# Patient Record
Sex: Male | Born: 1975
Health system: Southern US, Community
[De-identification: ages and names within clinical notes are randomized; demographics above are authoritative.]

## PROBLEM LIST (undated history)

## (undated) ENCOUNTER — Ambulatory Visit (HOSPITAL_COMMUNITY): Admission: EM | Payer: Self-pay

## (undated) ENCOUNTER — Emergency Department (HOSPITAL_COMMUNITY): Admission: EM | Payer: Self-pay | Source: Home / Self Care

---

## 2012-11-22 ENCOUNTER — Encounter (HOSPITAL_COMMUNITY): Payer: Self-pay | Admitting: *Deleted

## 2012-11-22 ENCOUNTER — Emergency Department (HOSPITAL_COMMUNITY)
Admission: EM | Admit: 2012-11-22 | Discharge: 2012-11-22 | Disposition: A | Discharge status: Home / Self Care | Payer: Commercial Managed Care - PPO | Attending: Emergency Medicine | Admitting: Emergency Medicine

## 2012-11-22 ENCOUNTER — Emergency Department (HOSPITAL_COMMUNITY): Payer: Commercial Managed Care - PPO

## 2012-11-22 DIAGNOSIS — E876 Hypokalemia: Secondary | ICD-10-CM | POA: Insufficient documentation

## 2012-11-22 DIAGNOSIS — F10929 Alcohol use, unspecified with intoxication, unspecified: Secondary | ICD-10-CM

## 2012-11-22 DIAGNOSIS — S31139A Puncture wound of abdominal wall without foreign body, unspecified quadrant without penetration into peritoneal cavity, initial encounter: Secondary | ICD-10-CM

## 2012-11-22 DIAGNOSIS — Z23 Encounter for immunization: Secondary | ICD-10-CM | POA: Insufficient documentation

## 2012-11-22 DIAGNOSIS — W3301XA Accidental discharge of shotgun, initial encounter: Secondary | ICD-10-CM | POA: Insufficient documentation

## 2012-11-22 DIAGNOSIS — S3690XA Unspecified injury of unspecified intra-abdominal organ, initial encounter: Secondary | ICD-10-CM | POA: Insufficient documentation

## 2012-11-22 DIAGNOSIS — F1010 Alcohol abuse, uncomplicated: Secondary | ICD-10-CM | POA: Insufficient documentation

## 2012-11-22 DIAGNOSIS — Y929 Unspecified place or not applicable: Secondary | ICD-10-CM | POA: Insufficient documentation

## 2012-11-22 DIAGNOSIS — S31109A Unspecified open wound of abdominal wall, unspecified quadrant without penetration into peritoneal cavity, initial encounter: Secondary | ICD-10-CM

## 2012-11-22 DIAGNOSIS — Y9389 Activity, other specified: Secondary | ICD-10-CM | POA: Insufficient documentation

## 2012-11-22 LAB — TYPE AND SCREEN
ABO/RH(D): B POS
Antibody Screen: NEGATIVE
Unit division: 0
Unit division: 0

## 2012-11-22 LAB — COMPREHENSIVE METABOLIC PANEL
ALT: 10 U/L (ref 0–53)
Albumin: 4.2 g/dL (ref 3.5–5.2)
Alkaline Phosphatase: 116 U/L (ref 39–117)
BUN: 11 mg/dL (ref 6–23)
Chloride: 100 mEq/L (ref 96–112)
Potassium: 3.2 mEq/L — ABNORMAL LOW (ref 3.5–5.1)
Sodium: 138 mEq/L (ref 135–145)
Total Bilirubin: 0.6 mg/dL (ref 0.3–1.2)
Total Protein: 8.3 g/dL (ref 6.0–8.3)

## 2012-11-22 LAB — CBC
HCT: 41.8 % (ref 39.0–52.0)
MCH: 33.3 pg (ref 26.0–34.0)
MCHC: 35.9 g/dL (ref 30.0–36.0)
MCV: 92.9 fL (ref 78.0–100.0)
Platelets: 257 10*3/uL (ref 150–400)
RDW: 12.9 % (ref 11.5–15.5)
WBC: 11.5 10*3/uL — ABNORMAL HIGH (ref 4.0–10.5)

## 2012-11-22 LAB — URINALYSIS, MICROSCOPIC ONLY
Bilirubin Urine: NEGATIVE
Hgb urine dipstick: NEGATIVE
Ketones, ur: NEGATIVE mg/dL
Nitrite: NEGATIVE
Protein, ur: NEGATIVE mg/dL
Specific Gravity, Urine: 1.023 (ref 1.005–1.030)
Urobilinogen, UA: 1 mg/dL (ref 0.0–1.0)

## 2012-11-22 LAB — PROTIME-INR
INR: 0.93 (ref 0.00–1.49)
Prothrombin Time: 12.4 seconds (ref 11.6–15.2)

## 2012-11-22 LAB — ABO/RH: ABO/RH(D): B POS

## 2012-11-22 LAB — POCT I-STAT, CHEM 8
Creatinine, Ser: 1.4 mg/dL — ABNORMAL HIGH (ref 0.50–1.35)
HCT: 46 % (ref 39.0–52.0)
Hemoglobin: 15.6 g/dL (ref 13.0–17.0)
Potassium: 3.1 mEq/L — ABNORMAL LOW (ref 3.5–5.1)
Sodium: 142 mEq/L (ref 135–145)
TCO2: 23 mmol/L (ref 0–100)

## 2012-11-22 MED ORDER — SODIUM CHLORIDE 0.9 % IV SOLN
1000.0000 mL | Freq: Once | INTRAVENOUS | Status: AC
  Administered 2012-11-22: 1000 mL via INTRAVENOUS

## 2012-11-22 MED ORDER — TETANUS-DIPHTH-ACELL PERTUSSIS 5-2.5-18.5 LF-MCG/0.5 IM SUSP
0.5000 mL | Freq: Once | INTRAMUSCULAR | Status: AC
  Administered 2012-11-22: 0.5 mL via INTRAMUSCULAR
  Filled 2012-11-22: qty 0.5

## 2012-11-22 MED ORDER — SODIUM CHLORIDE 0.9 % IV SOLN: 1000.0000 mL | INTRAVENOUS | Status: DC

## 2012-11-22 MED ORDER — OXYCODONE-ACETAMINOPHEN 5-325 MG PO TABS: 1.0000 | ORAL_TABLET | ORAL | Status: DC | PRN

## 2012-11-22 NOTE — ED Notes (Signed)
Dr Andrey Campanile here seeing the pt

## 2012-11-22 NOTE — ED Notes (Signed)
THE PT REMAINS ALERT SKIN WARM AND DRY.  WOUNDS CLEANED AND BANDAGED  WITH DSD GOWN AND BLANKETS CHANGED
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
## 2012-11-23 ENCOUNTER — Telehealth (HOSPITAL_COMMUNITY): Payer: Self-pay | Admitting: Emergency Medicine

## 2012-11-23 NOTE — Telephone Encounter (Signed)
Left message to call prn
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
## 2018-01-30 ENCOUNTER — Ambulatory Visit: Payer: Commercial Managed Care - PPO | Admitting: Family Medicine

## 2018-01-30 DIAGNOSIS — Z0289 Encounter for other administrative examinations: Secondary | ICD-10-CM

## 2019-04-09 ENCOUNTER — Encounter: Payer: Self-pay | Admitting: Allergy

## 2019-04-09 ENCOUNTER — Ambulatory Visit (INDEPENDENT_AMBULATORY_CARE_PROVIDER_SITE_OTHER): Payer: 59 | Admitting: Allergy

## 2019-04-09 ENCOUNTER — Other Ambulatory Visit: Payer: Self-pay

## 2019-04-09 VITALS — BP 122/84 | HR 78 | Temp 98.2°F | Resp 18 | Ht 71.77 in | Wt 211.6 lb

## 2019-04-09 DIAGNOSIS — T783XXA Angioneurotic edema, initial encounter: Secondary | ICD-10-CM | POA: Insufficient documentation

## 2019-04-09 DIAGNOSIS — T781XXA Other adverse food reactions, not elsewhere classified, initial encounter: Secondary | ICD-10-CM | POA: Insufficient documentation

## 2019-04-09 DIAGNOSIS — T781XXD Other adverse food reactions, not elsewhere classified, subsequent encounter: Secondary | ICD-10-CM

## 2019-04-09 DIAGNOSIS — L508 Other urticaria: Secondary | ICD-10-CM

## 2019-04-09 DIAGNOSIS — T7819XA Other adverse food reactions, not elsewhere classified, initial encounter: Secondary | ICD-10-CM | POA: Insufficient documentation

## 2019-04-09 DIAGNOSIS — T783XXD Angioneurotic edema, subsequent encounter: Secondary | ICD-10-CM

## 2019-04-09 MED ORDER — EPINEPHRINE 0.3 MG/0.3ML IJ SOAJ: 0.3000 mg | Freq: Once | INTRAMUSCULAR | 2 refills | Status: AC

## 2019-04-09 NOTE — Progress Notes (Signed)
New Patient Note  RE: Jason Decker MRN: [id number] DOB: [birth year] Date of Office Visit: 04/09/2019  Referring provider: No ref. provider found Primary care provider: Patient, No Pcp Per  Chief Complaint: Allergic Reaction  History of Present Illness: I had the pleasure of seeing Jason Decker for initial evaluation at the [facility] [place] on 04/09/2019. He is a 43 y.o. male, who is self-referred here for the evaluation of food allergy.   Food: He reports food allergy to shrimp. The reaction occurred last 1-2weeks, after he ate some shrimp and grits and shrimp kabobs. Symptoms started within 60 minutes and was in the form of whole body pruritus, rash, throat clearing, lip swelling. Denies any wheezing, abdominal pain, diarrhea, vomiting. Denies any associated cofactors such as exertion, infection, NSAID use, or alcohol consumption.  His reaction after the shrimp kabobs was more severe with facial swelling. He also had a tuna sandwich the day before.  The symptoms lasted for a few hours after benadryl but now noticing the rash/hives returning. No ecchymosis upon resolution He was not evaluated in ED. Since this episode, he does not report other accidental exposures to shellfish. He does not have access to epinephrine.  Patient usually eats shrimp and seafood with no issues.   Past work up includes: none.  Dietary History: patient has been eating other foods including milk, eggs, peanut, treenuts, sesame, seafood, soy, wheat, meats, fruits and vegetables.  He reports reading labels and avoiding shellfish in diet completely. Denies any  fevers, chills, changes in medications, foods, personal care products or recent infections.   No recent bloodwork and no recent physical.  Assessment and Plan: Jason Decker is a 43 y.o. male with: Acute urticaria 2 episodes of urticaria and 1 episode of facial angioedema after consuming shrimp. Symptoms occur within 1 hour and resolve after  taking benadryl. Last episode 1 week ago and still having hives. Denies any changes in diet, medications, personal care products. They did switch to a new laundry detergent about 2 months ago. He has been eating shellfish and seafood prior to this with no issues.  No history of hives/angioedema.  Unable to skin test today due to recent antihistamine intake.  Start to avoid seafood/shellfish.  Start xyzal 5mg  daily at night and take twice a day to help with the hives.   If it makes you too drowsy in the morning then take allegra in the morning and xyzal at night.  Keep track of symptoms.   I have prescribed epinephrine injectable and demonstrated proper use. For mild symptoms you can take over the counter antihistamines such as Benadryl and monitor symptoms closely. If symptoms worsen or if you have severe symptoms including breathing issues, throat closure, significant swelling, whole body hives, severe diarrhea and vomiting, lightheadedness then inject epinephrine and seek immediate medical care afterwards.  Food action plan given.   Avoid the following potential triggers: alcohol, tight clothing, NSAIDs.   Follow up in 2 weeks for skin testing - No allergy medication (xyzal, allegra, benadryl) for 3 days prior to appointment. If you can't come off the medication then will order bloodwork instead.   If hives persistent then will also order urticaria panel bloodwork.   Angio-edema  See assessment and plan as above for urticaria.   Return in about 2 weeks (around 04/23/2019) for Skin testing.  Meds ordered this encounter  Medications  . EPINEPHrine (AUVI-Q) 0.3 mg/0.3 mL IJ SOAJ injection    Sig: Inject 0.3 mLs (0.3 mg  total) into the muscle once for 1 dose. As directed for life-threatening allergic reactions    Dispense:  2 each    Refill:  2   Other allergy screening: Asthma: no Rhino conjunctivitis: no Medication allergy: no Hymenoptera allergy: no Urticaria: no Eczema:no  History of recurrent infections suggestive of immunodeficency: no  Diagnostics: Skin Testing: Deferred due to recent antihistamines use.  Past Medical History: Patient Active Problem List   Diagnosis Date Noted  . Acute urticaria 04/09/2019  . Adverse food reaction 04/09/2019  . Angio-edema 04/09/2019   History reviewed. No pertinent past medical history. Past Surgical History: History reviewed. No pertinent surgical history. Medication List:  Current Outpatient Medications  Medication Sig Dispense Refill  . diphenhydrAMINE (BENADRYL) 25 mg capsule Take 50 mg by mouth every 6 (six) hours as needed.    Marland Kitchen. EPINEPHrine (AUVI-Q) 0.3 mg/0.3 mL IJ SOAJ injection Inject 0.3 mLs (0.3 mg total) into the muscle once for 1 dose. As directed for life-threatening allergic reactions 2 each 2   No current facility-administered medications for this visit.    Allergies: No Known Allergies Social History: Social History   Socioeconomic History  . Marital status: Single    Spouse name: Not on file  . Number of children: Not on file  . Years of education: Not on file  . Highest education level: Not on file  Occupational History  . Not on file  Social Needs  . Financial resource strain: Not on file  . Food insecurity    Worry: Not on file    Inability: Not on file  . Transportation needs    Medical: Not on file    Non-medical: Not on file  Tobacco Use  . Smoking status: Former Games developermoker  . Smokeless tobacco: Never Used  Substance and Sexual Activity  . Alcohol use: Not on file  . Drug use: Not on file  . Sexual activity: Not on file  Lifestyle  . Physical activity    Days per week: Not on file    Minutes per session: Not on file  . Stress: Not on file  Relationships  . Social Musicianconnections    Talks on phone: Not on file    Gets together: Not on file    Attends religious service: Not on file    Active member of club or organization: Not on file    Attends meetings of clubs or  organizations: Not on file    Relationship status: Not on file  Other Topics Concern  . Not on file  Social History Narrative  . Not on file   Lives in a house. Smoking: denies Occupation: works for [employer]   Environmental History: ImmunologistWater Damage/mildew in the house: no Engineer, civil (consulting)Carpet in the family room: no Carpet in the bedroom: yes Heating: gas Cooling: central Pet: yes 1 dog x 1 yr  Family History: History reviewed. No pertinent family history. Problem                               Relation Asthma                                   No  Eczema  No  Food allergy                          No  Allergic rhino conjunctivitis     No  Hives/angioedema  No  Review of Systems  Constitutional: Negative for appetite change, chills, fever and unexpected weight change.  HENT: Negative for congestion and rhinorrhea.   Eyes: Negative for itching.  Respiratory: Negative for cough, chest tightness, shortness of breath and wheezing.   Cardiovascular: Negative for chest pain.  Gastrointestinal: Negative for abdominal pain.  Genitourinary: Negative for difficulty urinating.  Skin: Positive for rash.  Neurological: Negative for headaches.   Objective: BP 122/84   Pulse 78   Temp 98.2 F (36.8 C) (Oral)   Resp 18   Ht 5' 11.77" (1.823 m)   Wt 211 lb 9.6 oz (96 kg)   SpO2 98%   BMI 28.88 kg/m  Body mass index is 28.88 kg/m. Physical Exam  Constitutional: He is oriented to person, place, and time. He appears well-developed and well-nourished.  HENT:  Head: Normocephalic and atraumatic.  Right Ear: External ear normal.  Left Ear: External ear normal.  Nose: Nose normal.  Mouth/Throat: Oropharynx is clear and moist.  Eyes: Conjunctivae and EOM are normal.  Neck: Neck supple.  Cardiovascular: Normal rate, regular rhythm and normal heart sounds. Exam reveals no gallop and no friction rub.  No murmur heard. Pulmonary/Chest: Effort normal and breath  sounds normal. He has no wheezes. He has no rales.  Abdominal: Soft.  Neurological: He is alert and oriented to person, place, and time.  Skin: Skin is warm. Rash noted.  Few hives on left upper extremity and left lower extremity. Blanchable.   Psychiatric: He has a normal mood and affect. His behavior is normal.  Nursing note and vitals reviewed.  The plan was reviewed with the patient/family, and all questions/concerned were addressed.  It was my pleasure to see Jason Decker today and participate in his care. Please feel free to contact me with any questions or concerns.  Sincerely,  Wyline MoodYoon Stepan Verrette, DO Allergy & Immunology  [facility] [place] office: [phone] [facility] office: [phone] [place] office: [phone]

## 2019-04-09 NOTE — Assessment & Plan Note (Signed)
2 episodes of urticaria and 1 episode of facial angioedema after consuming shrimp. Symptoms occur within 1 hour and resolve after taking benadryl. Last episode 1 week ago and still having hives. Denies any changes in diet, medications, personal care products. They did switch to a new laundry detergent about 2 months ago. He has been eating shellfish and seafood prior to this with no issues.  No history of hives/angioedema.  Unable to skin test today due to recent antihistamine intake.  Start to avoid seafood/shellfish.  Start xyzal 5mg  daily at night and take twice a day to help with the hives.   If it makes you too drowsy in the morning then take allegra in the morning and xyzal at night.  Keep track of symptoms.   I have prescribed epinephrine injectable and demonstrated proper use. For mild symptoms you can take over the counter antihistamines such as Benadryl and monitor symptoms closely. If symptoms worsen or if you have severe symptoms including breathing issues, throat closure, significant swelling, whole body hives, severe diarrhea and vomiting, lightheadedness then inject epinephrine and seek immediate medical care afterwards.  Food action plan given.   Avoid the following potential triggers: alcohol, tight clothing, NSAIDs.   Follow up in 2 weeks for skin testing - No allergy medication (xyzal, allegra, benadryl) for 3 days prior to appointment. If you can't come off the medication then will order bloodwork instead.   If hives persistent then will also order urticaria panel bloodwork.

## 2019-04-09 NOTE — Assessment & Plan Note (Signed)
   See assessment and plan as above for urticaria.  

## 2019-04-09 NOTE — Patient Instructions (Addendum)
Start to avoid seafood/shellfish.   Start xyzal 5mg  daily at night and take twice a day.  If it makes you too drowsy in the morning then take allegra in the morning and xyzal at night.  Keep track of symptoms.   I have prescribed epinephrine injectable and demonstrated proper use. For mild symptoms you can take over the counter antihistamines such as Benadryl and monitor symptoms closely. If symptoms worsen or if you have severe symptoms including breathing issues, throat closure, significant swelling, whole body hives, severe diarrhea and vomiting, lightheadedness then inject epinephrine and seek immediate medical care afterwards.  Food action plan given.  Avoid the following potential triggers: alcohol, tight clothing, NSAIDs.   Follow up in 2 weeks for skin testing - No allergy medication (xyzal, allegra, benadryl) for 3 days prior to appointment. If you can't come off the medication then let us know.

## 2019-04-23 ENCOUNTER — Ambulatory Visit (INDEPENDENT_AMBULATORY_CARE_PROVIDER_SITE_OTHER): Payer: 59 | Admitting: Allergy

## 2019-04-23 ENCOUNTER — Other Ambulatory Visit: Payer: Self-pay

## 2019-04-23 ENCOUNTER — Encounter: Payer: Self-pay | Admitting: Allergy

## 2019-04-23 ENCOUNTER — Encounter: Payer: 59 | Admitting: Allergy

## 2019-04-23 VITALS — BP 120/80 | HR 80 | Temp 98.6°F | Resp 16

## 2019-04-23 DIAGNOSIS — L509 Urticaria, unspecified: Secondary | ICD-10-CM

## 2019-04-23 DIAGNOSIS — T781XXD Other adverse food reactions, not elsewhere classified, subsequent encounter: Secondary | ICD-10-CM | POA: Diagnosis not present

## 2019-04-23 DIAGNOSIS — T783XXD Angioneurotic edema, subsequent encounter: Secondary | ICD-10-CM | POA: Diagnosis not present

## 2019-04-23 MED ORDER — LEVOCETIRIZINE DIHYDROCHLORIDE 5 MG PO TABS: 5.0000 mg | ORAL_TABLET | Freq: Every evening | ORAL | 1 refills | Status: DC

## 2019-04-23 MED ORDER — EPINEPHRINE 0.3 MG/0.3ML IJ SOAJ: 0.3000 mg | Freq: Once | INTRAMUSCULAR | 2 refills | Status: DC

## 2019-04-23 NOTE — Progress Notes (Signed)
Follow Up Note  RE: Jason Decker MRN: [id number] DOB: 03/14/1976 Date of Office Visit: 04/23/2019  Referring provider: No ref. provider found Primary care provider: Patient, No Pcp Per  Chief Complaint: Allergic Reaction  History of Present Illness: I had the pleasure of seeing Jason Decker for a follow up visit at the [facility] [place] on 04/25/2019. He is a 43 y.o. male, who is being followed for acute urticaria and angioedema. Today he is here for skin testing. His previous allergy office visit was on 04/09/2019 with Dr. Maudie Mercury.   Urticaria/food allergy No hives with taking xyzal daily. He has been off the medication for 1 week and yesterday he tried a little bit of a salmon salad. He noticed some throat discomfort immediately and woke up with facial hives the next day which is now improving without taking any medications. Denies any other symptoms.   Assessment and Plan: Jason Decker is a 43 y.o. male with: Adverse food reaction Past history - 2 episodes of urticaria and 1 episode of facial angioedema after consuming shrimp. Symptoms occur within 1 hour and resolve after taking benadryl. Last episode 1 week ago and still having hives. Denies any changes in diet, medications, personal care products. They did switch to a new laundry detergent about 2 months ago. He has been eating shellfish and seafood prior to this with no issues.  No history of hives/angioedema. Interim history - was doing well with daily xyzal but last night had salmon salad and noticed throat discomfort. Woke up today with hives.   Today's skin testing was negative. The patients history suggests seafood/shellfish allergy, though todays skin tests were negative despite a positive histamine control.  Food allergen skin testing has excellent negative predictive value however there is still a 5% chance that the allergy exists. Therefore, we will investigate further with serum specific IgE levels and, if negative  then schedule for open graded oral food challenge. A laboratory order form has been provided for serum specific IgE against seafood/shellfish. Until the food allergy has been definitively ruled out, the patient is to continue meticulous avoidance of seafood/shellfish and have access to epinephrine autoinjector 2 pack. I have prescribed epinephrine injectable and demonstrated proper use. For mild symptoms you can take over the counter antihistamines such as Benadryl and monitor symptoms closely. If symptoms worsen or if you have severe symptoms including breathing issues, throat closure, significant swelling, whole body hives, severe diarrhea and vomiting, lightheadedness then inject epinephrine and seek immediate medical care afterwards. Food action plan given.   Angio-edema  See assessment and plan as above for urticaria.   Acute urticaria Woke up with hives this morning. No hives while taking daily xyzal.  Given yesterday's exposure to salmon, it is difficult to decipher whether the hives were from the seafood exposure versus not having antihistamines on board.  Get bloodwork as below.  Start xyzal 5mg  daily at night and take twice a day to help with the hives if needed. ? If it makes you too drowsy in the morning then take allegra in the morning and xyzal at night.  Keep track of symptoms.   Avoid the following potential triggers: alcohol, tight clothing, NSAIDs.   Return in about 2 months (around 06/23/2019).  Meds ordered this encounter  Medications  . levocetirizine (XYZAL) 5 MG tablet    Sig: Take 1 tablet (5 mg total) by mouth every evening.    Dispense:  90 tablet    Refill:  1  .  EPINEPHrine (AUVI-Q) 0.3 mg/0.3 mL IJ SOAJ injection    Sig: Inject 0.3 mLs (0.3 mg total) into the muscle once for 1 dose. As directed for life-threatening allergic reactions    Dispense:  2 each    Refill:  2    Lab Orders     Allergen Profile, Food-Fish     CBC with Differential/Platelet      Comprehensive metabolic panel     Thyroid Cascade Profile     ANA w/Reflex     Alpha-Gal Panel     C3 and C4     Chronic Urticaria     C-reactive protein     Allergen Profile, Shellfish  Diagnostics: Skin Testing: Select foods. Negative test to: seafood/shellfish.  Results discussed with patient/family. Food Adult Perc - 04/23/19 1500    Time Antigen Placed  1516    Allergen Manufacturer  Waynette ButteryGreer    Location  Back    Number of allergen test  16     Control-buffer 50% Glycerol  Negative    Control-Histamine 1 mg/ml  2+    8. Shellfish Mix  Negative    9. Fish Mix  Negative    18. Catfish  Negative    19. Bass  Negative    20. Trout  Negative    21. Tuna  Negative    22. Salmon  Negative    23. Flounder  Negative    24. Codfish  Negative    25. Shrimp  Negative    26. Crab  Negative    27. Lobster  Negative    28. Oyster  Negative    29. Scallops  Negative       Medication List:  Current Outpatient Medications  Medication Sig Dispense Refill  . diphenhydrAMINE (BENADRYL) 25 mg capsule Take 50 mg by mouth every 6 (six) hours as needed.    Marland Kitchen. levocetirizine (XYZAL) 5 MG tablet Take 1 tablet (5 mg total) by mouth every evening. 90 tablet 1   No current facility-administered medications for this visit.    Allergies: Allergies  Allergen Reactions  . Shellfish Allergy Anaphylaxis   I reviewed his past medical history, social history, family history, and environmental history and no significant changes have been reported from previous visit on 04/09/2019.  Review of Systems  Constitutional: Negative for appetite change, chills, fever and unexpected weight change.  HENT: Negative for congestion and rhinorrhea.   Eyes: Negative for itching.  Respiratory: Negative for cough, chest tightness, shortness of breath and wheezing.   Cardiovascular: Negative for chest pain.  Gastrointestinal: Negative for abdominal pain.  Genitourinary: Negative for difficulty urinating.   Skin: Positive for rash.  Neurological: Negative for headaches.   Objective: BP 120/80   Pulse 80   Temp 98.6 F (37 C) (Oral)   Resp 16   SpO2 98%  There is no height or weight on file to calculate BMI. Physical Exam  Constitutional: He is oriented to person, place, and time. He appears well-developed and well-nourished.  HENT:  Head: Normocephalic and atraumatic.  Right Ear: External ear normal.  Left Ear: External ear normal.  Nose: Nose normal.  Mouth/Throat: Oropharynx is clear and moist.  Eyes: Conjunctivae and EOM are normal.  Neck: Neck supple.  Cardiovascular: Normal rate, regular rhythm and normal heart sounds. Exam reveals no gallop and no friction rub.  No murmur heard. Pulmonary/Chest: Effort normal and breath sounds normal. He has no wheezes. He has no rales.  Abdominal: Soft.  Neurological: He is  alert and oriented to person, place, and time.  Skin: Skin is warm. No rash noted.  Psychiatric: He has a normal mood and affect. His behavior is normal.  Nursing note and vitals reviewed.  Previous notes and tests were reviewed. The plan was reviewed with the patient/family, and all questions/concerned were addressed.  It was my pleasure to see Jason Decker today and participate in his care. Please feel free to contact me with any questions or concerns.  Sincerely,  Wyline MoodYoon Kim, DO Allergy & Immunology  [facility] office: [phone] [facility] office: [phone] [place] office: [phone]

## 2019-04-23 NOTE — Progress Notes (Deleted)
   Follow Up Note  RE: Jason Decker MRN: [id number] DOB: 06-25-1976 Date of Office Visit: 04/23/2019  Referring provider: No ref. provider found Primary care provider: Patient, No Pcp Per  Chief Complaint: No chief complaint on file.  History of Present Illness: I had the pleasure of seeing Jason Decker for a follow up visit at the [facility] [place] [facility] [place] on 04/23/2019. He is a 43 y.o. male, who is being followed for ***. Today he is here for ***regular follow up visit. *** new complaint of ***. He is accompanied today by his *** who provided/contributed to the history. His previous allergy office visit was on *** with {Blank single:19197::"Dr. Kim","Dr. Kozlow","Dr. Bardelas","Dr. Bobbitt","Dr. Gallagher","Dr. Wyona Almas Ambs, FNP"}.   ***  Assessment and Plan: Jason Decker is a 43 y.o. male with: No problem-specific Assessment & Plan notes found for this encounter.  No follow-ups on file.  No orders of the defined types were placed in this encounter.   Lab Orders     Allergen Profile, Shellfish  Diagnostics: Spirometry:  Tracings reviewed. His effort: {Blank single:19197::"Good reproducible efforts.","It was hard to get consistent efforts and there is a question as to whether this reflects a maximal maneuver.","Poor effort, data can not be interpreted."} FVC: ***L FEV1: ***L, ***% predicted FEV1/FVC ratio: ***% Interpretation: {Blank single:19197::"Spirometry consistent with mild obstructive disease","Spirometry consistent with moderate obstructive disease","Spirometry consistent with severe obstructive disease","Spirometry consistent with possible restrictive disease","Spirometry consistent with mixed obstructive and restrictive disease","Spirometry uninterpretable due to technique","Spirometry consistent with normal pattern","No overt abnormalities noted given today's efforts"}.  Please see scanned spirometry results for details.  Skin Testing: {Blank  single:19197::"Select foods","Environmental allergy panel","Environmental allergy panel and select foods","Food allergy panel","None","Deferred due to recent antihistamines use"}. Positive test to: ***. Negative test to: ***.  Results discussed with patient/family.   Medication List:  Current Outpatient Medications  Medication Sig Dispense Refill  . diphenhydrAMINE (BENADRYL) 25 mg capsule Take 50 mg by mouth every 6 (six) hours as needed.     No current facility-administered medications for this visit.    Allergies: Allergies  Allergen Reactions  . Shellfish Allergy Anaphylaxis   I reviewed his past medical history, social history, family history, and environmental history and no significant changes have been reported from previous visit on ***.  Review of Systems Objective: There were no vitals taken for this visit. There is no height or weight on file to calculate BMI. Physical Exam Previous notes and tests were reviewed. The plan was reviewed with the patient/family, and all questions/concerned were addressed.  It was my pleasure to see Jason Decker today and participate in his care. Please feel free to contact me with any questions or concerns.  Sincerely,  Rexene Alberts, DO Allergy & Immunology  [facility] office: [phone] [facility] office: [place] office: [phone]

## 2019-04-23 NOTE — Patient Instructions (Signed)
Today's skin testing was negative but I still want you to avoid seafood and shellfish.   Get bloodwork.   Start xyzal 5mg  daily at night and take twice a day to help with the hives.  ? If it makes you too drowsy in the morning then take allegra in the morning and xyzal at night.  Keep track of symptoms.   For mild symptoms you can take over the counter antihistamines such as Benadryl and monitor symptoms closely. If symptoms worsen or if you have severe symptoms including breathing issues, throat closure, significant swelling, whole body hives, severe diarrhea and vomiting, lightheadedness then inject epinephrine and seek immediate medical care afterwards.   Avoid the following potential triggers: alcohol, tight clothing, NSAIDs.   Follow up in 2 months

## 2019-04-25 ENCOUNTER — Encounter: Payer: Self-pay | Admitting: Allergy

## 2019-04-25 NOTE — Assessment & Plan Note (Signed)
Woke up with hives this morning. No hives while taking daily xyzal.  Given yesterday's exposure to salmon, it is difficult to decipher whether the hives were from the seafood exposure versus not having antihistamines on board.  Get bloodwork as below.  Start xyzal 5mg  daily at night and take twice a day to help with the hives if needed. ? If it makes you too drowsy in the morning then take allegra in the morning and xyzal at night.  Keep track of symptoms.   Avoid the following potential triggers: alcohol, tight clothing, NSAIDs.

## 2019-04-25 NOTE — Assessment & Plan Note (Addendum)
Past history - 2 episodes of urticaria and 1 episode of facial angioedema after consuming shrimp. Symptoms occur within 1 hour and resolve after taking benadryl. Last episode 1 week ago and still having hives. Denies any changes in diet, medications, personal care products. They did switch to a new laundry detergent about 2 months ago. He has been eating shellfish and seafood prior to this with no issues.  No history of hives/angioedema. Interim history - was doing well with daily xyzal but last night had salmon salad and noticed throat discomfort. Woke up today with hives.   Today's skin testing was negative. The patients history suggests seafood/shellfish allergy, though todays skin tests were negative despite a positive histamine control.  Food allergen skin testing has excellent negative predictive value however there is still a 5% chance that the allergy exists. Therefore, we will investigate further with serum specific IgE levels and, if negative then schedule for open graded oral food challenge. A laboratory order form has been provided for serum specific IgE against seafood/shellfish. Until the food allergy has been definitively ruled out, the patient is to continue meticulous avoidance of seafood/shellfish and have access to epinephrine autoinjector 2 pack. I have prescribed epinephrine injectable and demonstrated proper use. For mild symptoms you can take over the counter antihistamines such as Benadryl and monitor symptoms closely. If symptoms worsen or if you have severe symptoms including breathing issues, throat closure, significant swelling, whole body hives, severe diarrhea and vomiting, lightheadedness then inject epinephrine and seek immediate medical care afterwards. Food action plan given.

## 2019-04-25 NOTE — Progress Notes (Signed)
This encounter was created in error - please disregard.

## 2019-04-25 NOTE — Assessment & Plan Note (Signed)
   See assessment and plan as above for urticaria.  

## 2019-04-26 MED ORDER — EPINEPHRINE 0.3 MG/0.3ML IJ SOAJ: 0.3000 mg | INTRAMUSCULAR | 2 refills | Status: DC | PRN

## 2019-04-26 NOTE — Addendum Note (Signed)
Addended by: Lucrezia Starch I on: 04/26/2019 08:36 AM   Modules accepted: Orders

## 2019-05-31 ENCOUNTER — Ambulatory Visit: Payer: 59 | Admitting: Emergency Medicine

## 2019-05-31 ENCOUNTER — Encounter: Payer: Self-pay | Admitting: Emergency Medicine

## 2019-05-31 ENCOUNTER — Other Ambulatory Visit: Payer: Self-pay

## 2019-05-31 VITALS — BP 117/73 | HR 91 | Temp 98.5°F | Resp 16 | Ht 71.0 in | Wt 205.6 lb

## 2019-05-31 DIAGNOSIS — Z0001 Encounter for general adult medical examination with abnormal findings: Secondary | ICD-10-CM

## 2019-05-31 DIAGNOSIS — R6889 Other general symptoms and signs: Secondary | ICD-10-CM

## 2019-05-31 DIAGNOSIS — Z7689 Persons encountering health services in other specified circumstances: Secondary | ICD-10-CM

## 2019-05-31 DIAGNOSIS — Z130 Encounter for screening for diseases of the blood and blood-forming organs and certain disorders involving the immune mechanism: Secondary | ICD-10-CM

## 2019-05-31 DIAGNOSIS — Z1329 Encounter for screening for other suspected endocrine disorder: Secondary | ICD-10-CM

## 2019-05-31 DIAGNOSIS — R5383 Other fatigue: Secondary | ICD-10-CM | POA: Diagnosis not present

## 2019-05-31 DIAGNOSIS — Z13220 Encounter for screening for lipoid disorders: Secondary | ICD-10-CM | POA: Diagnosis not present

## 2019-05-31 DIAGNOSIS — Z13228 Encounter for screening for other metabolic disorders: Secondary | ICD-10-CM

## 2019-05-31 DIAGNOSIS — R5381 Other malaise: Secondary | ICD-10-CM

## 2019-05-31 DIAGNOSIS — Z20822 Contact with and (suspected) exposure to covid-19: Secondary | ICD-10-CM

## 2019-05-31 LAB — POCT URINALYSIS DIP (MANUAL ENTRY)
Bilirubin, UA: NEGATIVE
Glucose, UA: >=1000 mg/dL — AB
Ketones, POC UA: NEGATIVE mg/dL
Leukocytes, UA: NEGATIVE
Nitrite, UA: NEGATIVE
Protein Ur, POC: NEGATIVE mg/dL
Spec Grav, UA: 1.02 (ref 1.010–1.025)
Urobilinogen, UA: 0.2 E.U./dL
pH, UA: 6 (ref 5.0–8.0)

## 2019-05-31 NOTE — Progress Notes (Addendum)
Jason Decker 43 y.o.   Chief Complaint  Patient presents with  . Establish Care    with CPE and wants COVID 19 test    HISTORY OF PRESENT ILLNESS: This is a 43 y.o. male here for annual exam and to establish care. Healthy man with a healthy lifestyle.  Non-smoker and no EtOH abuser. No chronic medical problems and no chronic medications. Complaining of general fatigue and feeling "sluggish" for the past 4 weeks. Complaining of dry cough. Elevated stress level with lack of sleep, average 5 hours.  No snoring or suspected sleep apnea. Works in [employer] traffic [employer]. No other complaints or medical concerns. Family history of diabetes.  HPI   Prior to Admission medications   Medication Sig Start Date End Date Taking? Authorizing Provider  diphenhydrAMINE (BENADRYL) 25 mg capsule Take 50 mg by mouth every 6 (six) hours as needed.   Yes [provider]  levocetirizine (XYZAL) 5 MG tablet Take 1 tablet (5 mg total) by mouth every evening. 04/23/19  Yes Ellamae SiaKim, Yoon M, DO  EPINEPHrine (AUVI-Q) 0.3 mg/0.3 mL IJ SOAJ injection Inject 0.3 mLs (0.3 mg total) into the muscle as needed for anaphylaxis. Patient not taking: Reported on 05/31/2019 04/26/19   Ellamae SiaKim, Yoon M, DO    Allergies  Allergen Reactions  . Shellfish Allergy Anaphylaxis    There are no active problems to display for this patient.   No past medical history on file.  No past surgical history on file.  Social History   Socioeconomic History  . Marital status: Single    Spouse name: Not on file  . Number of children: Not on file  . Years of education: Not on file  . Highest education level: Not on file  Occupational History  . Not on file  Social Needs  . Financial resource strain: Not on file  . Food insecurity    Worry: Not on file    Inability: Not on file  . Transportation needs    Medical: Not on file    Non-medical: Not on file  Tobacco Use  . Smoking status: Former Games developermoker  . Smokeless tobacco: Never Used   Substance and Sexual Activity  . Alcohol use: Yes  . Drug use: Not on file  . Sexual activity: Not on file  Lifestyle  . Physical activity    Days per week: Not on file    Minutes per session: Not on file  . Stress: Not on file  Relationships  . Social Musicianconnections    Talks on phone: Not on file    Gets together: Not on file    Attends religious service: Not on file    Active member of club or organization: Not on file    Attends meetings of clubs or organizations: Not on file    Relationship status: Not on file  . Intimate partner violence    Fear of current or ex partner: Not on file    Emotionally abused: Not on file    Physically abused: Not on file    Forced sexual activity: Not on file  Other Topics Concern  . Not on file  Social History Narrative   ** Merged History Encounter **        No family history on file.   Review of Systems  Constitutional: Positive for malaise/fatigue. Negative for chills and fever.  HENT: Negative.  Negative for congestion and sore throat.   Eyes: Negative.   Respiratory: Positive for cough.   Cardiovascular: Negative.  Negative for chest pain and palpitations.  Gastrointestinal: Negative.  Negative for abdominal pain, diarrhea, nausea and vomiting.  Genitourinary: Negative.  Negative for dysuria and hematuria.  Musculoskeletal: Negative.  Negative for back pain, myalgias and neck pain.  Skin: Negative.  Negative for rash.  Neurological: Positive for weakness and headaches. Negative for dizziness.  Endo/Heme/Allergies: Negative.   Psychiatric/Behavioral: The patient has insomnia.   All other systems reviewed and are negative.  Vitals:   05/31/19 1514  BP: 117/73  Pulse: 91  Resp: 16  Temp: 98.5 F (36.9 C)  SpO2: 96%     Physical Exam Vitals signs reviewed.  Constitutional:      Appearance: Normal appearance.  HENT:     Head: Normocephalic.     Mouth/Throat:     Mouth: Mucous membranes are moist.     Pharynx:  Oropharynx is clear.  Eyes:     Extraocular Movements: Extraocular movements intact.     Conjunctiva/sclera: Conjunctivae normal.     Pupils: Pupils are equal, round, and reactive to light.  Neck:     Musculoskeletal: Normal range of motion and neck supple. No muscular tenderness.     Vascular: No carotid bruit.  Cardiovascular:     Rate and Rhythm: Normal rate and regular rhythm.     Pulses: Normal pulses.     Heart sounds: Normal heart sounds.  Pulmonary:     Effort: Pulmonary effort is normal.     Breath sounds: Normal breath sounds.  Abdominal:     General: There is no distension.     Palpations: Abdomen is soft. There is no mass.     Tenderness: There is no abdominal tenderness. There is no right CVA tenderness, left CVA tenderness or guarding.  Musculoskeletal: Normal range of motion.  Lymphadenopathy:     Cervical: No cervical adenopathy.  Skin:    General: Skin is warm.     Capillary Refill: Capillary refill takes less than 2 seconds.  Neurological:     General: No focal deficit present.     Mental Status: He is alert and oriented to person, place, and time.  Psychiatric:        Mood and Affect: Mood normal.        Behavior: Behavior normal.      ASSESSMENT & PLAN: [name] was seen today for establish care.  Diagnoses and all orders for this visit:  Encounter for general adult medical examination with abnormal findings  Screening for deficiency anemia -     CBC with Differential/Platelet  Screening for lipoid disorders -     Lipid panel  Screening for endocrine, metabolic and immunity disorder -     TSH -     Hemoglobin A1c  Malaise and fatigue -     CBC with Differential/Platelet -     Comprehensive metabolic panel -     TSH -     TestT+TestF+SHBG -     POCT urinalysis dipstick  Suspected Covid-19 Virus Infection -     Novel Coronavirus, NAA ([facility])  Encounter to establish care    Patient Instructions       If you have lab work done  today you will be contacted with your lab results within the next 2 weeks.  If you have not heard from us then please contact us. The fastest way to get your results is to register for My Chart.   IF you received an x-ray today, you will receive an invoice [facility]. Please  contact [facility] at [phone] with questions or concerns regarding your invoice.   IF you received labwork today, you will receive an invoice from [place]. Please contact [facility] at [phone] with questions or concerns regarding your invoice.   Our billing staff will not be able to assist you with questions regarding bills from these companies.  You will be contacted with the lab results as soon as they are available. The fastest way to get your results is to activate your My Chart account. Instructions are located on the last page of this paperwork. If you have not heard from [place] regarding the results in 2 weeks, please contact this office.      Health Maintenance, Male Adopting a healthy lifestyle and getting preventive care are important in promoting health and wellness. Ask your health care provider about:  The right schedule for you to have regular tests and exams.  Things you can do on your own to prevent diseases and keep yourself healthy. What should I know about diet, weight, and exercise? Eat a healthy diet   Eat a diet that includes plenty of vegetables, fruits, low-fat dairy products, and lean protein.  Do not eat a lot of foods that are high in solid fats, added sugars, or sodium. Maintain a healthy weight Body mass index (BMI) is a measurement that can be used to identify possible weight problems. It estimates body fat based on height and weight. Your health care provider can help determine your BMI and help you achieve or maintain a healthy weight. Get regular exercise Get regular exercise. This is one of the most important things you can do for your health. Most  adults should:  Exercise for at least 150 minutes each week. The exercise should increase your heart rate and make you sweat (moderate-intensity exercise).  Do strengthening exercises at least twice a week. This is in addition to the moderate-intensity exercise.  Spend less time sitting. Even light physical activity can be beneficial. Watch cholesterol and blood lipids Have your blood tested for lipids and cholesterol at 42 years of age, then have this test every 5 years. You may need to have your cholesterol levels checked more often if:  Your lipid or cholesterol levels are high.  You are older than 42 years of age.  You are at high risk for heart disease. What should I know about cancer screening? Many types of cancers can be detected early and may often be prevented. Depending on your health history and family history, you may need to have cancer screening at various ages. This may include screening for:  Colorectal cancer.  Prostate cancer.  Skin cancer.  Lung cancer. What should I know about heart disease, diabetes, and high blood pressure? Blood pressure and heart disease  High blood pressure causes heart disease and increases the risk of stroke. This is more likely to develop in people who have high blood pressure readings, are of African descent, or are overweight.  Talk with your health care provider about your target blood pressure readings.  Have your blood pressure checked: ? Every 3-5 years if you are 59-44 years of age. ? Every year if you are 98 years old or older.  If you are between the ages of 70 and 46 and are a current or former smoker, ask your health care provider if you should have a one-time screening for abdominal aortic aneurysm (AAA). Diabetes Have regular diabetes screenings. This checks your fasting blood sugar level. Have the screening done:  Once every three years after age 58 if you are at a normal weight and have a low risk for diabetes.   More often and at a younger age if you are overweight or have a high risk for diabetes. What should I know about preventing infection? Hepatitis B If you have a higher risk for hepatitis B, you should be screened for this virus. Talk with your health care provider to find out if you are at risk for hepatitis B infection. Hepatitis C Blood testing is recommended for:  Everyone born from [birth year] through 1964.  Anyone with known risk factors for hepatitis C. Sexually transmitted infections (STIs)  You should be screened each year for STIs, including gonorrhea and chlamydia, if: ? You are sexually active and are younger than 42 years of age. ? You are older than 42 years of age and your health care provider tells you that you are at risk for this type of infection. ? Your sexual activity has changed since you were last screened, and you are at increased risk for chlamydia or gonorrhea. Ask your health care provider if you are at risk.  Ask your health care provider about whether you are at high risk for HIV. Your health care provider may recommend a prescription medicine to help prevent HIV infection. If you choose to take medicine to prevent HIV, you should first get tested for HIV. You should then be tested every 3 months for as long as you are taking the medicine. Follow these instructions at home: Lifestyle  Do not use any products that contain nicotine or tobacco, such as cigarettes, e-cigarettes, and chewing tobacco. If you need help quitting, ask your health care provider.  Do not use street drugs.  Do not share needles.  Ask your health care provider for help if you need support or information about quitting drugs. Alcohol use  Do not drink alcohol if your health care provider tells you not to drink.  If you drink alcohol: ? Limit how much you have to 0-2 drinks a day. ? Be aware of how much alcohol is in your drink. In the [place] one drink equals one 12 oz bottle of beer (355 mL),  one 5 oz glass of wine (148 mL), or one 1 oz glass of hard liquor (44 mL). General instructions  Schedule regular health, dental, and eye exams.  Stay current with your vaccines.  Tell your health care provider if: ? You often feel depressed. ? You have ever been abused or do not feel safe at home. Summary  Adopting a healthy lifestyle and getting preventive care are important in promoting health and wellness.  Follow your health care provider's instructions about healthy diet, exercising, and getting tested or screened for diseases.  Follow your health care provider's instructions on monitoring your cholesterol and blood pressure. This information is not intended to replace advice given to you by your health care provider. Make sure you discuss any questions you have with your health care provider. Document Released: 02/22/2008 Document Revised: 08/19/2018 Document Reviewed: 08/19/2018 Elsevier Patient Education  2020 Elsevier Inc.      Edwina Barth, MD [facility]

## 2019-05-31 NOTE — Patient Instructions (Addendum)
   If you have lab work done today you will be contacted with your lab results within the next 2 weeks.  If you have not heard from us then please contact us. The fastest way to get your results is to register for My Chart.   IF you received an x-ray today, you will receive an invoice from [facility]. Please contact [facility] at [phone] with questions or concerns regarding your invoice.   IF you received labwork today, you will receive an invoice from [facility]. Please contact [facility] at [phone] with questions or concerns regarding your invoice.   Our billing staff will not be able to assist you with questions regarding bills from these companies.  You will be contacted with the lab results as soon as they are available. The fastest way to get your results is to activate your My Chart account. Instructions are located on the last page of this paperwork. If you have not heard from us regarding the results in 2 weeks, please contact this office.     Health Maintenance, Male Adopting a healthy lifestyle and getting preventive care are important in promoting health and wellness. Ask your health care provider about:  The right schedule for you to have regular tests and exams.  Things you can do on your own to prevent diseases and keep yourself healthy. What should I know about diet, weight, and exercise? Eat a healthy diet   Eat a diet that includes plenty of vegetables, fruits, low-fat dairy products, and lean protein.  Do not eat a lot of foods that are high in solid fats, added sugars, or sodium. Maintain a healthy weight Body mass index (BMI) is a measurement that can be used to identify possible weight problems. It estimates body fat based on height and weight. Your health care provider can help determine your BMI and help you achieve or maintain a healthy weight. Get regular exercise Get regular exercise. This is one of the most important things you  can do for your health. Most adults should:  Exercise for at least 150 minutes each week. The exercise should increase your heart rate and make you sweat (moderate-intensity exercise).  Do strengthening exercises at least twice a week. This is in addition to the moderate-intensity exercise.  Spend less time sitting. Even light physical activity can be beneficial. Watch cholesterol and blood lipids Have your blood tested for lipids and cholesterol at 42 years of age, then have this test every 5 years. You may need to have your cholesterol levels checked more often if:  Your lipid or cholesterol levels are high.  You are older than 42 years of age.  You are at high risk for heart disease. What should I know about cancer screening? Many types of cancers can be detected early and may often be prevented. Depending on your health history and family history, you may need to have cancer screening at various ages. This may include screening for:  Colorectal cancer.  Prostate cancer.  Skin cancer.  Lung cancer. What should I know about heart disease, diabetes, and high blood pressure? Blood pressure and heart disease  High blood pressure causes heart disease and increases the risk of stroke. This is more likely to develop in people who have high blood pressure readings, are of African descent, or are overweight.  Talk with your health care provider about your target blood pressure readings.  Have your blood pressure checked: ? Every 3-5 years if you are 18-39 years   of age. ? Every year if you are 40 years old or older.  If you are between the ages of 65 and 75 and are a current or former smoker, ask your health care provider if you should have a one-time screening for abdominal aortic aneurysm (AAA). Diabetes Have regular diabetes screenings. This checks your fasting blood sugar level. Have the screening done:  Once every three years after age 45 if you are at a normal weight and have  a low risk for diabetes.  More often and at a younger age if you are overweight or have a high risk for diabetes. What should I know about preventing infection? Hepatitis B If you have a higher risk for hepatitis B, you should be screened for this virus. Talk with your health care provider to find out if you are at risk for hepatitis B infection. Hepatitis C Blood testing is recommended for:  Everyone born from 1945 through 1965.  Anyone with known risk factors for hepatitis C. Sexually transmitted infections (STIs)  You should be screened each year for STIs, including gonorrhea and chlamydia, if: ? You are sexually active and are younger than 42 years of age. ? You are older than 42 years of age and your health care provider tells you that you are at risk for this type of infection. ? Your sexual activity has changed since you were last screened, and you are at increased risk for chlamydia or gonorrhea. Ask your health care provider if you are at risk.  Ask your health care provider about whether you are at high risk for HIV. Your health care provider may recommend a prescription medicine to help prevent HIV infection. If you choose to take medicine to prevent HIV, you should first get tested for HIV. You should then be tested every 3 months for as long as you are taking the medicine. Follow these instructions at home: Lifestyle  Do not use any products that contain nicotine or tobacco, such as cigarettes, e-cigarettes, and chewing tobacco. If you need help quitting, ask your health care provider.  Do not use street drugs.  Do not share needles.  Ask your health care provider for help if you need support or information about quitting drugs. Alcohol use  Do not drink alcohol if your health care provider tells you not to drink.  If you drink alcohol: ? Limit how much you have to 0-2 drinks a day. ? Be aware of how much alcohol is in your drink. In the [place] one drink equals one 12  oz bottle of beer (355 mL), one 5 oz glass of wine (148 mL), or one 1 oz glass of hard liquor (44 mL). General instructions  Schedule regular health, dental, and eye exams.  Stay current with your vaccines.  Tell your health care provider if: ? You often feel depressed. ? You have ever been abused or do not feel safe at home. Summary  Adopting a healthy lifestyle and getting preventive care are important in promoting health and wellness.  Follow your health care provider's instructions about healthy diet, exercising, and getting tested or screened for diseases.  Follow your health care provider's instructions on monitoring your cholesterol and blood pressure. This information is not intended to replace advice given to you by your health care provider. Make sure you discuss any questions you have with your health care provider. Document Released: 02/22/2008 Document Revised: 08/19/2018 Document Reviewed: 08/19/2018 Elsevier Patient Education  2020 Elsevier Inc.  

## 2019-06-01 ENCOUNTER — Other Ambulatory Visit: Payer: Self-pay

## 2019-06-01 ENCOUNTER — Other Ambulatory Visit: Payer: Self-pay | Admitting: Emergency Medicine

## 2019-06-01 ENCOUNTER — Encounter: Payer: Self-pay | Admitting: Emergency Medicine

## 2019-06-01 DIAGNOSIS — E1169 Type 2 diabetes mellitus with other specified complication: Secondary | ICD-10-CM

## 2019-06-01 DIAGNOSIS — E119 Type 2 diabetes mellitus without complications: Secondary | ICD-10-CM

## 2019-06-01 DIAGNOSIS — Z20822 Contact with and (suspected) exposure to covid-19: Secondary | ICD-10-CM

## 2019-06-01 DIAGNOSIS — E785 Hyperlipidemia, unspecified: Secondary | ICD-10-CM | POA: Insufficient documentation

## 2019-06-01 HISTORY — DX: Type 2 diabetes mellitus with other specified complication: E11.69

## 2019-06-01 MED ORDER — GLIPIZIDE 5 MG PO TABS: 5.0000 mg | ORAL_TABLET | Freq: Two times a day (BID) | ORAL | 1 refills | Status: DC

## 2019-06-01 MED ORDER — METFORMIN HCL 500 MG PO TABS: 500.0000 mg | ORAL_TABLET | Freq: Two times a day (BID) | ORAL | 3 refills | Status: DC

## 2019-06-01 MED ORDER — ROSUVASTATIN CALCIUM 20 MG PO TABS: 20.0000 mg | ORAL_TABLET | Freq: Every day | ORAL | 3 refills | Status: DC

## 2019-06-03 ENCOUNTER — Telehealth: Payer: Self-pay | Admitting: Emergency Medicine

## 2019-06-03 LAB — NOVEL CORONAVIRUS, NAA: SARS-CoV-2, NAA: NOT DETECTED

## 2019-06-03 NOTE — Telephone Encounter (Signed)
Discussed blood results as well as new medications and management of diabetes.  Follow-up in the office in 4 weeks.

## 2019-06-06 LAB — COMPREHENSIVE METABOLIC PANEL
ALT: 77 IU/L — ABNORMAL HIGH (ref 0–44)
AST: 39 IU/L (ref 0–40)
Albumin/Globulin Ratio: 1 — ABNORMAL LOW (ref 1.2–2.2)
Albumin: 4 g/dL (ref 4.0–5.0)
Alkaline Phosphatase: 212 IU/L — ABNORMAL HIGH (ref 39–117)
BUN/Creatinine Ratio: 15 (ref 9–20)
BUN: 18 mg/dL (ref 6–24)
Bilirubin Total: 0.4 mg/dL (ref 0.0–1.2)
CO2: 25 mmol/L (ref 20–29)
Calcium: 9.7 mg/dL (ref 8.7–10.2)
Chloride: 92 mmol/L — ABNORMAL LOW (ref 96–106)
Creatinine, Ser: 1.19 mg/dL (ref 0.76–1.27)
GFR calc Af Amer: 87 mL/min/{1.73_m2} (ref >59)
GFR calc non Af Amer: 75 mL/min/{1.73_m2} (ref >59)
Globulin, Total: 4 g/dL (ref 1.5–4.5)
Glucose: 489 mg/dL — ABNORMAL HIGH (ref 65–99)
Potassium: 5.9 mmol/L — ABNORMAL HIGH (ref 3.5–5.2)
Sodium: 132 mmol/L — ABNORMAL LOW (ref 134–144)
Total Protein: 8 g/dL (ref 6.0–8.5)

## 2019-06-06 LAB — TESTT+TESTF+SHBG
Sex Hormone Binding: 39.8 nmol/L (ref 16.5–55.9)
Testosterone, Free: 5.3 pg/mL — ABNORMAL LOW (ref 6.8–21.5)
Testosterone, Total, LC/MS: 402.4 ng/dL (ref 264.0–916.0)

## 2019-06-06 LAB — LIPID PANEL
Chol/HDL Ratio: 7.7 ratio — ABNORMAL HIGH (ref 0.0–5.0)
Cholesterol, Total: 170 mg/dL (ref 100–199)
HDL: 22 mg/dL — ABNORMAL LOW (ref >39)
LDL Chol Calc (NIH): 94 mg/dL (ref 0–99)
Triglycerides: 320 mg/dL — ABNORMAL HIGH (ref 0–149)
VLDL Cholesterol Cal: 54 mg/dL — ABNORMAL HIGH (ref 5–40)

## 2019-06-06 LAB — CBC WITH DIFFERENTIAL/PLATELET
Basophils Absolute: 0 10*3/uL (ref 0.0–0.2)
Basos: 0 %
EOS (ABSOLUTE): 0.1 10*3/uL (ref 0.0–0.4)
Eos: 2 %
Hematocrit: 42 % (ref 37.5–51.0)
Hemoglobin: 13.6 g/dL (ref 13.0–17.7)
Immature Grans (Abs): 0 10*3/uL (ref 0.0–0.1)
Immature Granulocytes: 0 %
Lymphocytes Absolute: 1.4 10*3/uL (ref 0.7–3.1)
Lymphs: 22 %
MCH: 30.1 pg (ref 26.6–33.0)
MCHC: 32.4 g/dL (ref 31.5–35.7)
MCV: 93 fL (ref 79–97)
Monocytes Absolute: 0.4 10*3/uL (ref 0.1–0.9)
Monocytes: 7 %
Neutrophils Absolute: 4.5 10*3/uL (ref 1.4–7.0)
Neutrophils: 69 %
Platelets: 429 10*3/uL (ref 150–450)
RBC: 4.52 x10E6/uL (ref 4.14–5.80)
RDW: 12.8 % (ref 11.6–15.4)
WBC: 6.5 10*3/uL (ref 3.4–10.8)

## 2019-06-06 LAB — TSH: TSH: 0.966 u[IU]/mL (ref 0.450–4.500)

## 2019-06-06 LAB — HEMOGLOBIN A1C
Est. average glucose Bld gHb Est-mCnc: 154 mg/dL
Hgb A1c MFr Bld: 7 % — ABNORMAL HIGH (ref 4.8–5.6)

## 2019-06-23 ENCOUNTER — Other Ambulatory Visit: Payer: Self-pay

## 2019-06-23 ENCOUNTER — Telehealth: Payer: Self-pay | Admitting: *Deleted

## 2019-06-23 ENCOUNTER — Encounter: Payer: Self-pay | Admitting: Emergency Medicine

## 2019-06-23 ENCOUNTER — Ambulatory Visit: Payer: 59 | Admitting: Emergency Medicine

## 2019-06-23 VITALS — BP 112/66 | HR 71 | Temp 98.9°F | Resp 16 | Ht 71.0 in | Wt 209.0 lb

## 2019-06-23 DIAGNOSIS — E1169 Type 2 diabetes mellitus with other specified complication: Secondary | ICD-10-CM

## 2019-06-23 DIAGNOSIS — E785 Hyperlipidemia, unspecified: Secondary | ICD-10-CM | POA: Diagnosis not present

## 2019-06-23 DIAGNOSIS — E1165 Type 2 diabetes mellitus with hyperglycemia: Secondary | ICD-10-CM

## 2019-06-23 LAB — POCT GLYCOSYLATED HEMOGLOBIN (HGB A1C): Hemoglobin A1C: 6.6 % — AB (ref 4.0–5.6)

## 2019-06-23 LAB — GLUCOSE, POCT (MANUAL RESULT ENTRY): POC Glucose: 93 mg/dl (ref 70–99)

## 2019-06-23 MED ORDER — BLOOD GLUCOSE MONITOR KIT: PACK | 0 refills | Status: DC

## 2019-06-23 NOTE — Telephone Encounter (Signed)
Faxed Rx for blood glucose monitor kit with supplies to [facility] [address] Confirmation page 2:55 pm.

## 2019-06-23 NOTE — Patient Instructions (Addendum)
   If you have lab work done today you will be contacted with your lab results within the next 2 weeks.  If you have not heard from us then please contact us. The fastest way to get your results is to register for My Chart.   IF you received an x-ray today, you will receive an invoice from [facility]. Please contact [facility] at [phone] with questions or concerns regarding your invoice.   IF you received labwork today, you will receive an invoice from [facility]. Please contact [facility] at [phone] with questions or concerns regarding your invoice.   Our billing staff will not be able to assist you with questions regarding bills from these companies.  You will be contacted with the lab results as soon as they are available. The fastest way to get your results is to activate your My Chart account. Instructions are located on the last page of this paperwork. If you have not heard from us regarding the results in 2 weeks, please contact this office.     Diabetes Mellitus and Nutrition, Adult When you have diabetes (diabetes mellitus), it is very important to have healthy eating habits because your blood sugar (glucose) levels are greatly affected by what you eat and drink. Eating healthy foods in the appropriate amounts, at about the same times every day, can help you:  Control your blood glucose.  Lower your risk of heart disease.  Improve your blood pressure.  Reach or maintain a healthy weight. Every person with diabetes is different, and each person has different needs for a meal plan. Your health care provider may recommend that you work with a diet and nutrition specialist (dietitian) to make a meal plan that is best for you. Your meal plan may vary depending on factors such as:  The calories you need.  The medicines you take.  Your weight.  Your blood glucose, blood pressure, and cholesterol levels.  Your activity level.  Other health conditions  you have, such as heart or kidney disease. How do carbohydrates affect me? Carbohydrates, also called carbs, affect your blood glucose level more than any other type of food. Eating carbs naturally raises the amount of glucose in your blood. Carb counting is a method for keeping track of how many carbs you eat. Counting carbs is important to keep your blood glucose at a healthy level, especially if you use insulin or take certain oral diabetes medicines. It is important to know how many carbs you can safely have in each meal. This is different for every person. Your dietitian can help you calculate how many carbs you should have at each meal and for each snack. Foods that contain carbs include:  Bread, cereal, rice, pasta, and crackers.  Potatoes and corn.  Peas, beans, and lentils.  Milk and yogurt.  Fruit and juice.  Desserts, such as cakes, cookies, ice cream, and candy. How does alcohol affect me? Alcohol can cause a sudden decrease in blood glucose (hypoglycemia), especially if you use insulin or take certain oral diabetes medicines. Hypoglycemia can be a life-threatening condition. Symptoms of hypoglycemia (sleepiness, dizziness, and confusion) are similar to symptoms of having too much alcohol. If your health care provider says that alcohol is safe for you, follow these guidelines:  Limit alcohol intake to no more than 1 drink per day for nonpregnant women and 2 drinks per day for men. One drink equals 12 oz of beer, 5 oz of wine, or 1 oz of hard liquor.    Do not drink on an empty stomach.  Keep yourself hydrated with water, diet soda, or unsweetened iced tea.  Keep in mind that regular soda, juice, and other mixers may contain a lot of sugar and must be counted as carbs. What are tips for following this plan?  Reading food labels  Start by checking the serving size on the "Nutrition Facts" label of packaged foods and drinks. The amount of calories, carbs, fats, and other  nutrients listed on the label is based on one serving of the item. Many items contain more than one serving per package.  Check the total grams (g) of carbs in one serving. You can calculate the number of servings of carbs in one serving by dividing the total carbs by 15. For example, if a food has 30 g of total carbs, it would be equal to 2 servings of carbs.  Check the number of grams (g) of saturated and trans fats in one serving. Choose foods that have low or no amount of these fats.  Check the number of milligrams (mg) of salt (sodium) in one serving. Most people should limit total sodium intake to less than 2,300 mg per day.  Always check the nutrition information of foods labeled as "low-fat" or "nonfat". These foods may be higher in added sugar or refined carbs and should be avoided.  Talk to your dietitian to identify your daily goals for nutrients listed on the label. Shopping  Avoid buying canned, premade, or processed foods. These foods tend to be high in fat, sodium, and added sugar.  Shop around the outside edge of the grocery store. This includes fresh fruits and vegetables, bulk grains, fresh meats, and fresh dairy. Cooking  Use low-heat cooking methods, such as baking, instead of high-heat cooking methods like deep frying.  Cook using healthy oils, such as olive, canola, or sunflower oil.  Avoid cooking with butter, cream, or high-fat meats. Meal planning  Eat meals and snacks regularly, preferably at the same times every day. Avoid going long periods of time without eating.  Eat foods high in fiber, such as fresh fruits, vegetables, beans, and whole grains. Talk to your dietitian about how many servings of carbs you can eat at each meal.  Eat 4-6 ounces (oz) of lean protein each day, such as lean meat, chicken, fish, eggs, or tofu. One oz of lean protein is equal to: ? 1 oz of meat, chicken, or fish. ? 1 egg. ?  cup of tofu.  Eat some foods each day that contain  healthy fats, such as avocado, nuts, seeds, and fish. Lifestyle  Check your blood glucose regularly.  Exercise regularly as told by your health care provider. This may include: ? 150 minutes of moderate-intensity or vigorous-intensity exercise each week. This could be brisk walking, biking, or water aerobics. ? Stretching and doing strength exercises, such as yoga or weightlifting, at least 2 times a week.  Take medicines as told by your health care provider.  Do not use any products that contain nicotine or tobacco, such as cigarettes and e-cigarettes. If you need help quitting, ask your health care provider.  Work with a counselor or diabetes educator to identify strategies to manage stress and any emotional and social challenges. Questions to ask a health care provider  Do I need to meet with a diabetes educator?  Do I need to meet with a dietitian?  What number can I call if I have questions?  When are the best times to   check my blood glucose? Where to find more information:  American Diabetes Association: diabetes.org  Academy of Nutrition and Dietetics: www.eatright.org  National Institute of Diabetes and Digestive and Kidney Diseases (NIH): www.niddk.nih.gov Summary  A healthy meal plan will help you control your blood glucose and maintain a healthy lifestyle.  Working with a diet and nutrition specialist (dietitian) can help you make a meal plan that is best for you.  Keep in mind that carbohydrates (carbs) and alcohol have immediate effects on your blood glucose levels. It is important to count carbs and to use alcohol carefully. This information is not intended to replace advice given to you by your health care provider. Make sure you discuss any questions you have with your health care provider. Document Released: 05/23/2005 Document Revised: 08/08/2017 Document Reviewed: 09/30/2016 Elsevier Patient Education  2020 Elsevier Inc.  

## 2019-06-23 NOTE — Progress Notes (Signed)
Jason Decker 43 y.o.   Chief Complaint  Patient presents with  . Diabetes    follow up 4 weeks    HISTORY OF PRESENT ILLNESS: This is a 43 y.o. male recently diagnosed with diabetes and started on metformin and glipizide twice a day.  Feeling much better.  Home blood glucose readings in the 90s. Has no complaints today.  HPI   Prior to Admission medications   Medication Sig Start Date End Date Taking? Authorizing Provider  EPINEPHrine (AUVI-Q) 0.3 mg/0.3 mL IJ SOAJ injection Inject 0.3 mLs (0.3 mg total) into the muscle as needed for anaphylaxis. 04/26/19  Yes Jason Sierras, DO  glipiZIDE (GLUCOTROL) 5 MG tablet Take 1 tablet (5 mg total) by mouth 2 (two) times daily before a meal. 06/01/19 08/30/19 Yes Jason Decker, Ines Bloomer, MD  metFORMIN (GLUCOPHAGE) 500 MG tablet Take 1 tablet (500 mg total) by mouth 2 (two) times daily with a meal. 06/01/19  Yes Jason Decker, Ines Bloomer, MD  rosuvastatin (CRESTOR) 20 MG tablet Take 1 tablet (20 mg total) by mouth daily. 06/01/19  Yes Jason Decker, Ines Bloomer, MD  blood glucose meter kit and supplies KIT Dispense based on patient and insurance preference. Use up to four times daily as directed. (FOR ICD-9 250.00, 250.01). 06/23/19   Jason Pollen, MD  diphenhydrAMINE (BENADRYL) 25 mg capsule Take 50 mg by mouth every 6 (six) hours as needed.    [provider]  levocetirizine (XYZAL) 5 MG tablet Take 1 tablet (5 mg total) by mouth every evening. Patient not taking: Reported on 06/23/2019 04/23/19   Jason Sierras, DO    Allergies  Allergen Reactions  . Shellfish Allergy Anaphylaxis    Patient Active Problem List   Diagnosis Date Noted  . Dyslipidemia associated with type 2 diabetes mellitus ([place]) 06/01/2019  . Dyslipidemia 06/01/2019  . Malaise and fatigue 05/31/2019    No past medical history on file.  No past surgical history on file.  Social History   Socioeconomic History  . Marital status: Single    Spouse name: Not on file   . Number of children: Not on file  . Years of education: Not on file  . Highest education level: Not on file  Occupational History  . Not on file  Social Needs  . Financial resource strain: Not on file  . Food insecurity    Worry: Not on file    Inability: Not on file  . Transportation needs    Medical: Not on file    Non-medical: Not on file  Tobacco Use  . Smoking status: Former Research scientist (life sciences)  . Smokeless tobacco: Never Used  Substance and Sexual Activity  . Alcohol use: Yes  . Drug use: Not on file  . Sexual activity: Not on file  Lifestyle  . Physical activity    Days per week: Not on file    Minutes per session: Not on file  . Stress: Not on file  Relationships  . Social Herbalist on phone: Not on file    Gets together: Not on file    Attends religious service: Not on file    Active member of club or organization: Not on file    Attends meetings of clubs or organizations: Not on file    Relationship status: Not on file  . Intimate partner violence    Fear of current or ex partner: Not on file    Emotionally abused: Not on file    Physically abused:  Not on file    Forced sexual activity: Not on file  Other Topics Concern  . Not on file  Social History Narrative   ** Merged History Encounter **        No family history on file.   Review of Systems  Constitutional: Negative.  Negative for chills and fever.  HENT: Negative.  Negative for sore throat.   Eyes: Negative.   Respiratory: Negative.  Negative for cough and shortness of breath.   Cardiovascular: Negative.  Negative for chest pain and palpitations.  Gastrointestinal: Negative.  Negative for abdominal pain, diarrhea, nausea and vomiting.  Genitourinary: Negative.   Musculoskeletal: Negative.   Skin: Negative.  Negative for rash.  Neurological: Negative for dizziness and headaches.  All other systems reviewed and are negative.  Vitals:   06/23/19 1409  BP: 112/66  Pulse: 71  Resp: 16   Temp: 98.9 F (37.2 C)  SpO2: 97%     Physical Exam Vitals signs reviewed.  Constitutional:      Appearance: Normal appearance.  HENT:     Head: Normocephalic.  Eyes:     Extraocular Movements: Extraocular movements intact.     Conjunctiva/sclera: Conjunctivae normal.     Pupils: Pupils are equal, round, and reactive to light.  Neck:     Musculoskeletal: Normal range of motion and neck supple.  Cardiovascular:     Rate and Rhythm: Normal rate and regular rhythm.     Pulses: Normal pulses.     Heart sounds: Normal heart sounds.  Abdominal:     General: There is no distension.     Palpations: Abdomen is soft.     Tenderness: There is no abdominal tenderness.  Musculoskeletal: Normal range of motion.  Skin:    General: Skin is warm and dry.     Capillary Refill: Capillary refill takes less than 2 seconds.  Neurological:     General: No focal deficit present.     Mental Status: He is alert and oriented to person, place, and time.  Psychiatric:        Mood and Affect: Mood normal.    Results for orders placed or performed in visit on 06/23/19 (from the past 24 hour(s))  POCT glucose (manual entry)     Status: None   Collection Time: 06/23/19  2:45 PM  Result Value Ref Range   POC Glucose 93 70 - 99 mg/dl  POCT A1C     Status: Abnormal   Collection Time: 06/23/19  2:55 PM  Result Value Ref Range   Hemoglobin A1C 6.6 (A) 4.0 - 5.6 %   HbA1c POC (<> result, manual entry)     HbA1c, POC (prediabetic range)     HbA1c, POC (controlled diabetic range)       ASSESSMENT & PLAN: Jason Decker was seen today for diabetes.  Diagnoses and all orders for this visit:  Dyslipidemia associated with type 2 diabetes mellitus ([place])  Type 2 diabetes mellitus with hyperglycemia, without long-term current use of insulin ([facility]) -     Microalbumin, urine -     Ambulatory referral to Ophthalmology -     HM Diabetes Foot Exam -     POCT glucose (manual entry) -     blood glucose meter kit  and supplies KIT; Dispense based on patient and insurance preference. Use up to four times daily as directed. (FOR ICD-9 250.00, 250.01). -     Comprehensive metabolic panel -     Cancel: Hemoglobin A1c -  POCT A1C    Patient Instructions       If you have lab work done today you will be contacted with your lab results within the next 2 weeks.  If you have not heard from [place] then please contact us. The fastest way to get your results is to register for My Chart.   IF you received an x-ray today, you will receive an invoice from [facility]. Please contact [facility] at [phone] with questions or concerns regarding your invoice.   IF you received labwork today, you will receive an invoice from [facility]. Please contact [facility] at [phone] with questions or concerns regarding your invoice.   Our billing staff will not be able to assist you with questions regarding bills from these companies.  You will be contacted with the lab results as soon as they are available. The fastest way to get your results is to activate your My Chart account. Instructions are located on the last page of this paperwork. If you have not heard from [place] regarding the results in 2 weeks, please contact this office.     Diabetes Mellitus and Nutrition, Adult When you have diabetes (diabetes mellitus), it is very important to have healthy eating habits because your blood sugar (glucose) levels are greatly affected by what you eat and drink. Eating healthy foods in the appropriate amounts, at about the same times every day, can help you:  Control your blood glucose.  Lower your risk of heart disease.  Improve your blood pressure.  Reach or maintain a healthy weight. Every person with diabetes is different, and each person has different needs for a meal plan. Your health care provider may recommend that you work with a diet and nutrition specialist (dietitian) to make a meal plan that  is best for you. Your meal plan may vary depending on factors such as:  The calories you need.  The medicines you take.  Your weight.  Your blood glucose, blood pressure, and cholesterol levels.  Your activity level.  Other health conditions you have, such as heart or kidney disease. How do carbohydrates affect me? Carbohydrates, also called carbs, affect your blood glucose level more than any other type of food. Eating carbs naturally raises the amount of glucose in your blood. Carb counting is a method for keeping track of how many carbs you eat. Counting carbs is important to keep your blood glucose at a healthy level, especially if you use insulin or take certain oral diabetes medicines. It is important to know how many carbs you can safely have in each meal. This is different for every person. Your dietitian can help you calculate how many carbs you should have at each meal and for each snack. Foods that contain carbs include:  Bread, cereal, rice, pasta, and crackers.  Potatoes and corn.  Peas, beans, and lentils.  Milk and yogurt.  Fruit and juice.  Desserts, such as cakes, cookies, ice cream, and candy. How does alcohol affect me? Alcohol can cause a sudden decrease in blood glucose (hypoglycemia), especially if you use insulin or take certain oral diabetes medicines. Hypoglycemia can be a life-threatening condition. Symptoms of hypoglycemia (sleepiness, dizziness, and confusion) are similar to symptoms of having too much alcohol. If your health care provider says that alcohol is safe for you, follow these guidelines:  Limit alcohol intake to no more than 1 drink per day for nonpregnant women and 2 drinks per day for men. One drink equals 12 oz of beer,  5 oz of wine, or 1 oz of hard liquor.  Do not drink on an empty stomach.  Keep yourself hydrated with water, diet soda, or unsweetened iced tea.  Keep in mind that regular soda, juice, and other mixers may contain a lot  of sugar and must be counted as carbs. What are tips for following this plan?  Reading food labels  Start by checking the serving size on the "Nutrition Facts" label of packaged foods and drinks. The amount of calories, carbs, fats, and other nutrients listed on the label is based on one serving of the item. Many items contain more than one serving per package.  Check the total grams (g) of carbs in one serving. You can calculate the number of servings of carbs in one serving by dividing the total carbs by 15. For example, if a food has 30 g of total carbs, it would be equal to 2 servings of carbs.  Check the number of grams (g) of saturated and trans fats in one serving. Choose foods that have low or no amount of these fats.  Check the number of milligrams (mg) of salt (sodium) in one serving. Most people should limit total sodium intake to less than 2,300 mg per day.  Always check the nutrition information of foods labeled as "low-fat" or "nonfat". These foods may be higher in added sugar or refined carbs and should be avoided.  Talk to your dietitian to identify your daily goals for nutrients listed on the label. Shopping  Avoid buying canned, premade, or processed foods. These foods tend to be high in fat, sodium, and added sugar.  Shop around the outside edge of the grocery store. This includes fresh fruits and vegetables, bulk grains, fresh meats, and fresh dairy. Cooking  Use low-heat cooking methods, such as baking, instead of high-heat cooking methods like deep frying.  Cook using healthy oils, such as olive, canola, or sunflower oil.  Avoid cooking with butter, cream, or high-fat meats. Meal planning  Eat meals and snacks regularly, preferably at the same times every day. Avoid going long periods of time without eating.  Eat foods high in fiber, such as fresh fruits, vegetables, beans, and whole grains. Talk to your dietitian about how many servings of carbs you can eat at  each meal.  Eat 4-6 ounces (oz) of lean protein each day, such as lean meat, chicken, fish, eggs, or tofu. One oz of lean protein is equal to: ? 1 oz of meat, chicken, or fish. ? 1 egg. ?  cup of tofu.  Eat some foods each day that contain healthy fats, such as avocado, nuts, seeds, and fish. Lifestyle  Check your blood glucose regularly.  Exercise regularly as told by your health care provider. This may include: ? 150 minutes of moderate-intensity or vigorous-intensity exercise each week. This could be brisk walking, biking, or water aerobics. ? Stretching and doing strength exercises, such as yoga or weightlifting, at least 2 times a week.  Take medicines as told by your health care provider.  Do not use any products that contain nicotine or tobacco, such as cigarettes and e-cigarettes. If you need help quitting, ask your health care provider.  Work with a Social worker or diabetes educator to identify strategies to manage stress and any emotional and social challenges. Questions to ask a health care provider  Do I need to meet with a diabetes educator?  Do I need to meet with a dietitian?  What number can I call  if I have questions?  When are the best times to check my blood glucose? Where to find more information:  American Diabetes Association: diabetes.org  Academy of Nutrition and Dietetics: www.eatright.[employer] of Diabetes and Digestive and Kidney Diseases (NIH): [employer] Summary  A healthy meal plan will help you control your blood glucose and maintain a healthy lifestyle.  Working with a diet and nutrition specialist (dietitian) can help you make a meal plan that is best for you.  Keep in mind that carbohydrates (carbs) and alcohol have immediate effects on your blood glucose levels. It is important to count carbs and to use alcohol carefully. This information is not intended to replace advice given to you by your health care provider. Make  sure you discuss any questions you have with your health care provider. Document Released: 05/23/2005 Document Revised: 08/08/2017 Document Reviewed: 09/30/2016 Elsevier Patient Education  2020 Elsevier Inc.      Agustina Caroli, MD [facility]

## 2019-06-23 NOTE — Assessment & Plan Note (Signed)
Much improved.  Hemoglobin A1c at 6.6.  Compliant with medications and nutrition. Continue present medications.  No changes.  Follow-up in 3 months.

## 2019-06-24 LAB — COMPREHENSIVE METABOLIC PANEL
ALT: 26 IU/L (ref 0–44)
AST: 19 IU/L (ref 0–40)
Albumin/Globulin Ratio: 1.4 (ref 1.2–2.2)
Albumin: 4.3 g/dL (ref 4.0–5.0)
Alkaline Phosphatase: 124 IU/L — ABNORMAL HIGH (ref 39–117)
BUN/Creatinine Ratio: 12 (ref 9–20)
BUN: 13 mg/dL (ref 6–24)
Bilirubin Total: 0.8 mg/dL (ref 0.0–1.2)
CO2: 21 mmol/L (ref 20–29)
Calcium: 9.4 mg/dL (ref 8.7–10.2)
Chloride: 103 mmol/L (ref 96–106)
Creatinine, Ser: 1.1 mg/dL (ref 0.76–1.27)
GFR calc Af Amer: 96 mL/min/{1.73_m2} (ref >59)
GFR calc non Af Amer: 83 mL/min/{1.73_m2} (ref >59)
Globulin, Total: 3.1 g/dL (ref 1.5–4.5)
Glucose: 83 mg/dL (ref 65–99)
Potassium: 4.3 mmol/L (ref 3.5–5.2)
Sodium: 139 mmol/L (ref 134–144)
Total Protein: 7.4 g/dL (ref 6.0–8.5)

## 2019-06-24 LAB — MICROALBUMIN, URINE: Microalbumin, Urine: <3 ug/mL

## 2019-09-22 ENCOUNTER — Other Ambulatory Visit: Payer: Self-pay

## 2019-09-22 ENCOUNTER — Encounter: Payer: Self-pay | Admitting: Emergency Medicine

## 2019-09-22 ENCOUNTER — Ambulatory Visit: Payer: 59 | Admitting: Emergency Medicine

## 2019-09-22 VITALS — BP 125/73 | HR 71 | Temp 98.3°F | Resp 16 | Ht 71.0 in | Wt 213.0 lb

## 2019-09-22 DIAGNOSIS — E1165 Type 2 diabetes mellitus with hyperglycemia: Secondary | ICD-10-CM | POA: Diagnosis not present

## 2019-09-22 DIAGNOSIS — E1169 Type 2 diabetes mellitus with other specified complication: Secondary | ICD-10-CM

## 2019-09-22 DIAGNOSIS — E785 Hyperlipidemia, unspecified: Secondary | ICD-10-CM | POA: Diagnosis not present

## 2019-09-22 LAB — GLUCOSE, POCT (MANUAL RESULT ENTRY): POC Glucose: 117 mg/dl — AB (ref 70–99)

## 2019-09-22 LAB — POCT GLYCOSYLATED HEMOGLOBIN (HGB A1C): Hemoglobin A1C: 5.8 % — AB (ref 4.0–5.6)

## 2019-09-22 MED ORDER — BLOOD GLUCOSE MONITOR KIT: PACK | 0 refills | Status: DC

## 2019-09-22 MED ORDER — METFORMIN HCL 500 MG PO TABS: 500.0000 mg | ORAL_TABLET | Freq: Two times a day (BID) | ORAL | 3 refills | Status: DC

## 2019-09-22 MED ORDER — ROSUVASTATIN CALCIUM 20 MG PO TABS: 20.0000 mg | ORAL_TABLET | Freq: Every day | ORAL | 3 refills | Status: DC

## 2019-09-22 NOTE — Assessment & Plan Note (Signed)
Diabetes much improved with hemoglobin A1c of 5.8.  Continue metformin and stop glipizide.  Diet and nutrition discussed with patient.  Continue rosuvastatin 20 mg.  Follow-up in 3 months.

## 2019-09-22 NOTE — Patient Instructions (Addendum)
   If you have lab work done today you will be contacted with your lab results within the next 2 weeks.  If you have not heard from us then please contact us. The fastest way to get your results is to register for My Chart.   IF you received an x-ray today, you will receive an invoice from [facility]. Please contact [facility] at [phone] with questions or concerns regarding your invoice.   IF you received labwork today, you will receive an invoice from [facility]. Please contact [facility] at [phone] with questions or concerns regarding your invoice.   Our billing staff will not be able to assist you with questions regarding bills from these companies.  You will be contacted with the lab results as soon as they are available. The fastest way to get your results is to activate your My Chart account. Instructions are located on the last page of this paperwork. If you have not heard from us regarding the results in 2 weeks, please contact this office.      Diabetes Mellitus and Nutrition, Adult When you have diabetes (diabetes mellitus), it is very important to have healthy eating habits because your blood sugar (glucose) levels are greatly affected by what you eat and drink. Eating healthy foods in the appropriate amounts, at about the same times every day, can help you:  Control your blood glucose.  Lower your risk of heart disease.  Improve your blood pressure.  Reach or maintain a healthy weight. Every person with diabetes is different, and each person has different needs for a meal plan. Your health care provider may recommend that you work with a diet and nutrition specialist (dietitian) to make a meal plan that is best for you. Your meal plan may vary depending on factors such as:  The calories you need.  The medicines you take.  Your weight.  Your blood glucose, blood pressure, and cholesterol levels.  Your activity level.  Other health  conditions you have, such as heart or kidney disease. How do carbohydrates affect me? Carbohydrates, also called carbs, affect your blood glucose level more than any other type of food. Eating carbs naturally raises the amount of glucose in your blood. Carb counting is a method for keeping track of how many carbs you eat. Counting carbs is important to keep your blood glucose at a healthy level, especially if you use insulin or take certain oral diabetes medicines. It is important to know how many carbs you can safely have in each meal. This is different for every person. Your dietitian can help you calculate how many carbs you should have at each meal and for each snack. Foods that contain carbs include:  Bread, cereal, rice, pasta, and crackers.  Potatoes and corn.  Peas, beans, and lentils.  Milk and yogurt.  Fruit and juice.  Desserts, such as cakes, cookies, ice cream, and candy. How does alcohol affect me? Alcohol can cause a sudden decrease in blood glucose (hypoglycemia), especially if you use insulin or take certain oral diabetes medicines. Hypoglycemia can be a life-threatening condition. Symptoms of hypoglycemia (sleepiness, dizziness, and confusion) are similar to symptoms of having too much alcohol. If your health care provider says that alcohol is safe for you, follow these guidelines:  Limit alcohol intake to no more than 1 drink per day for nonpregnant women and 2 drinks per day for men. One drink equals 12 oz of beer, 5 oz of wine, or 1 oz of hard   liquor.  Do not drink on an empty stomach.  Keep yourself hydrated with water, diet soda, or unsweetened iced tea.  Keep in mind that regular soda, juice, and other mixers may contain a lot of sugar and must be counted as carbs. What are tips for following this plan?  Reading food labels  Start by checking the serving size on the "Nutrition Facts" label of packaged foods and drinks. The amount of calories, carbs, fats, and  other nutrients listed on the label is based on one serving of the item. Many items contain more than one serving per package.  Check the total grams (g) of carbs in one serving. You can calculate the number of servings of carbs in one serving by dividing the total carbs by 15. For example, if a food has 30 g of total carbs, it would be equal to 2 servings of carbs.  Check the number of grams (g) of saturated and trans fats in one serving. Choose foods that have low or no amount of these fats.  Check the number of milligrams (mg) of salt (sodium) in one serving. Most people should limit total sodium intake to less than 2,300 mg per day.  Always check the nutrition information of foods labeled as "low-fat" or "nonfat". These foods may be higher in added sugar or refined carbs and should be avoided.  Talk to your dietitian to identify your daily goals for nutrients listed on the label. Shopping  Avoid buying canned, premade, or processed foods. These foods tend to be high in fat, sodium, and added sugar.  Shop around the outside edge of the grocery store. This includes fresh fruits and vegetables, bulk grains, fresh meats, and fresh dairy. Cooking  Use low-heat cooking methods, such as baking, instead of high-heat cooking methods like deep frying.  Cook using healthy oils, such as olive, canola, or sunflower oil.  Avoid cooking with butter, cream, or high-fat meats. Meal planning  Eat meals and snacks regularly, preferably at the same times every day. Avoid going long periods of time without eating.  Eat foods high in fiber, such as fresh fruits, vegetables, beans, and whole grains. Talk to your dietitian about how many servings of carbs you can eat at each meal.  Eat 4-6 ounces (oz) of lean protein each day, such as lean meat, chicken, fish, eggs, or tofu. One oz of lean protein is equal to: ? 1 oz of meat, chicken, or fish. ? 1 egg. ?  cup of tofu.  Eat some foods each day that  contain healthy fats, such as avocado, nuts, seeds, and fish. Lifestyle  Check your blood glucose regularly.  Exercise regularly as told by your health care provider. This may include: ? 150 minutes of moderate-intensity or vigorous-intensity exercise each week. This could be brisk walking, biking, or water aerobics. ? Stretching and doing strength exercises, such as yoga or weightlifting, at least 2 times a week.  Take medicines as told by your health care provider.  Do not use any products that contain nicotine or tobacco, such as cigarettes and e-cigarettes. If you need help quitting, ask your health care provider.  Work with a counselor or diabetes educator to identify strategies to manage stress and any emotional and social challenges. Questions to ask a health care provider  Do I need to meet with a diabetes educator?  Do I need to meet with a dietitian?  What number can I call if I have questions?  When are the best   times to check my blood glucose? Where to find more information:  American Diabetes Association: diabetes.org  Academy of Nutrition and Dietetics: www.eatright.org  National Institute of Diabetes and Digestive and Kidney Diseases (NIH): www.niddk.nih.gov Summary  A healthy meal plan will help you control your blood glucose and maintain a healthy lifestyle.  Working with a diet and nutrition specialist (dietitian) can help you make a meal plan that is best for you.  Keep in mind that carbohydrates (carbs) and alcohol have immediate effects on your blood glucose levels. It is important to count carbs and to use alcohol carefully. This information is not intended to replace advice given to you by your health care provider. Make sure you discuss any questions you have with your health care provider. Document Revised: 08/08/2017 Document Reviewed: 09/30/2016 Elsevier Patient Education  2020 Elsevier Inc.  

## 2019-09-22 NOTE — Progress Notes (Signed)
Jason Decker 44 y.o.   Chief Complaint  Patient presents with  . Diabetes    3 month    HISTORY OF PRESENT ILLNESS: This is a 44 y.o. male with history of diabetes and dyslipidemia here for 54-monthfollow-up. Was taking Metformin and glipizide twice a day. Developed hypoglycemia episodes at home.  Stopped both medications 4 weeks ago. Still taking rosuvastatin 20 mg daily. No other complaints or medical concerns today. Lab Results  Component Value Date   HGBA1C 6.6 (A) 06/23/2019   Wt Readings from Last 3 Encounters:  09/22/19 213 lb (96.6 kg)  06/23/19 209 lb (94.8 kg)  05/31/19 205 lb 9.6 oz (93.3 kg)   Lab Results  Component Value Date   CHOL 170 05/31/2019   HDL 22 (L) 05/31/2019   LDLCALC 94 05/31/2019   TRIG 320 (H) 05/31/2019   CHOLHDL 7.7 (H) 05/31/2019     HPI   Prior to Admission medications   Medication Sig Start Date End Date Taking? Authorizing Provider  diphenhydrAMINE (BENADRYL) 25 mg capsule Take 50 mg by mouth every 6 (six) hours as needed.   Yes [provider]  EPINEPHrine (AUVI-Q) 0.3 mg/0.3 mL IJ SOAJ injection Inject 0.3 mLs (0.3 mg total) into the muscle as needed for anaphylaxis. 04/26/19  Yes KGarnet Sierras DO  blood glucose meter kit and supplies KIT Dispense based on patient and insurance preference. Use up to four times daily as directed. (FOR ICD-9 250.00, 250.01). 06/23/19   SHorald Pollen MD  glipiZIDE (GLUCOTROL) 5 MG tablet Take 1 tablet (5 mg total) by mouth 2 (two) times daily before a meal. 06/01/19 08/30/19  Ladislao Cohenour, MInes Bloomer MD  levocetirizine (XYZAL) 5 MG tablet Take 1 tablet (5 mg total) by mouth every evening. Patient not taking: Reported on 09/22/2019 04/23/19   KGarnet Sierras DO  metFORMIN (GLUCOPHAGE) 500 MG tablet Take 1 tablet (500 mg total) by mouth 2 (two) times daily with a meal. Patient not taking: Reported on 09/22/2019 06/01/19   SHorald Pollen MD  rosuvastatin (CRESTOR) 20 MG tablet Take 1 tablet  (20 mg total) by mouth daily. 09/22/19   SHorald Pollen MD    Allergies  Allergen Reactions  . Shellfish Allergy Anaphylaxis    Patient Active Problem List   Diagnosis Date Noted  . Dyslipidemia associated with type 2 diabetes mellitus ([place] 06/01/2019  . Dyslipidemia 06/01/2019    History reviewed. No pertinent past medical history.  History reviewed. No pertinent surgical history.  Social History   Socioeconomic History  . Marital status: Single    Spouse name: Not on file  . Number of children: Not on file  . Years of education: Not on file  . Highest education level: Not on file  Occupational History  . Not on file  Tobacco Use  . Smoking status: Former SResearch scientist (life sciences) . Smokeless tobacco: Never Used  Substance and Sexual Activity  . Alcohol use: Yes  . Drug use: Not on file  . Sexual activity: Not on file  Other Topics Concern  . Not on file  Social History Narrative   ** Merged History Encounter **       Social Determinants of Health   Financial Resource Strain:   . Difficulty of Paying Living Expenses: Not on file  Food Insecurity:   . Worried About RCharity fundraiserin the Last Year: Not on file  . Ran Out of Food in the Last Year: Not on file  Transportation Needs:   . Film/video editor (Medical): Not on file  . Lack of Transportation (Non-Medical): Not on file  Physical Activity:   . Days of Exercise per Week: Not on file  . Minutes of Exercise per Session: Not on file  Stress:   . Feeling of Stress : Not on file  Social Connections:   . Frequency of Communication with Friends and Family: Not on file  . Frequency of Social Gatherings with Friends and Family: Not on file  . Attends Religious Services: Not on file  . Active Member of Clubs or Organizations: Not on file  . Attends Archivist Meetings: Not on file  . Marital Status: Not on file  Intimate Partner Violence:   . Fear of Current or Ex-Partner: Not on file  .  Emotionally Abused: Not on file  . Physically Abused: Not on file  . Sexually Abused: Not on file    History reviewed. No pertinent family history.   Review of Systems  Constitutional: Negative.  Negative for fever.  HENT: Negative.  Negative for congestion and sore throat.   Respiratory: Negative.  Negative for cough and shortness of breath.   Cardiovascular: Negative.  Negative for chest pain and palpitations.  Gastrointestinal: Negative.  Negative for abdominal pain, blood in stool, diarrhea, nausea and vomiting.  Genitourinary: Negative.  Negative for dysuria and hematuria.  Musculoskeletal: Negative.  Negative for back pain, myalgias and neck pain.  Skin: Negative.  Negative for rash.  Neurological: Negative for dizziness and headaches.  Endo/Heme/Allergies: Negative.   All other systems reviewed and are negative.   Vitals:   09/22/19 1431  BP: 125/73  Pulse: 71  Resp: 16  Temp: 98.3 F (36.8 C)  SpO2: 97%    Physical Exam Vitals reviewed.  Constitutional:      Appearance: Normal appearance.  HENT:     Head: Normocephalic.  Eyes:     Extraocular Movements: Extraocular movements intact.     Conjunctiva/sclera: Conjunctivae normal.     Pupils: Pupils are equal, round, and reactive to light.  Cardiovascular:     Rate and Rhythm: Normal rate and regular rhythm.     Pulses: Normal pulses.     Heart sounds: Normal heart sounds.  Pulmonary:     Effort: Pulmonary effort is normal.     Breath sounds: Normal breath sounds.  Musculoskeletal:        General: Normal range of motion.     Cervical back: Normal range of motion and neck supple.  Skin:    General: Skin is warm and dry.  Neurological:     General: No focal deficit present.     Mental Status: He is alert and oriented to person, place, and time.  Psychiatric:        Mood and Affect: Mood normal.        Behavior: Behavior normal.    A total of 30 minutes was spent in the room with the patient, greater  than 50% of which was in counseling/coordination of care regarding review of most recent blood work, review of most recent office visit, diabetes and management including diet, nutrition, medications with side effects, hypoglycemia precautions, need for increased physical activity, prognosis and need for follow-up in 3 months.   ASSESSMENT & PLAN: Dyslipidemia associated with type 2 diabetes mellitus ([place]) Diabetes much improved with hemoglobin A1c of 5.8.  Continue metformin and stop glipizide.  Diet and nutrition discussed with patient.  Continue rosuvastatin 20 mg.  Follow-up in 3 months.  Shadow was seen today for diabetes.  Diagnoses and all orders for this visit:  Dyslipidemia associated with type 2 diabetes mellitus ([place]) -     rosuvastatin (CRESTOR) 20 MG tablet; Take 1 tablet (20 mg total) by mouth daily. -     Comprehensive metabolic panel -     Lipid panel -     blood glucose meter kit and supplies KIT; Dispense based on patient and insurance preference. Use up to four times daily as directed. (FOR ICD-9 250.00, 250.01). -     metFORMIN (GLUCOPHAGE) 500 MG tablet; Take 1 tablet (500 mg total) by mouth 2 (two) times daily with a meal.  Type 2 diabetes mellitus with hyperglycemia, without long-term current use of insulin ([facility]) -     POCT glucose (manual entry) -     POCT glycosylated hemoglobin (Hb A1C)    Patient Instructions       If you have lab work done today you will be contacted with your lab results within the next 2 weeks.  If you have not heard from [place] then please contact us. The fastest way to get your results is to register for My Chart.   IF you received an x-ray today, you will receive an invoice from [facility]. Please contact [facility] at [phone] with questions or concerns regarding your invoice.   IF you received labwork today, you will receive an invoice from [place]. Please contact [facility] at [phone] with questions or  concerns regarding your invoice.   Our billing staff will not be able to assist you with questions regarding bills from these companies.  You will be contacted with the lab results as soon as they are available. The fastest way to get your results is to activate your My Chart account. Instructions are located on the last page of this paperwork. If you have not heard from [place] regarding the results in 2 weeks, please contact this office.     Diabetes Mellitus and Nutrition, Adult When you have diabetes (diabetes mellitus), it is very important to have healthy eating habits because your blood sugar (glucose) levels are greatly affected by what you eat and drink. Eating healthy foods in the appropriate amounts, at about the same times every day, can help you:  Control your blood glucose.  Lower your risk of heart disease.  Improve your blood pressure.  Reach or maintain a healthy weight. Every person with diabetes is different, and each person has different needs for a meal plan. Your health care provider may recommend that you work with a diet and nutrition specialist (dietitian) to make a meal plan that is best for you. Your meal plan may vary depending on factors such as:  The calories you need.  The medicines you take.  Your weight.  Your blood glucose, blood pressure, and cholesterol levels.  Your activity level.  Other health conditions you have, such as heart or kidney disease. How do carbohydrates affect me? Carbohydrates, also called carbs, affect your blood glucose level more than any other type of food. Eating carbs naturally raises the amount of glucose in your blood. Carb counting is a method for keeping track of how many carbs you eat. Counting carbs is important to keep your blood glucose at a healthy level, especially if you use insulin or take certain oral diabetes medicines. It is important to know how many carbs you can safely have in each meal. This is different for  every person.  Your dietitian can help you calculate how many carbs you should have at each meal and for each snack. Foods that contain carbs include:  Bread, cereal, rice, pasta, and crackers.  Potatoes and corn.  Peas, beans, and lentils.  Milk and yogurt.  Fruit and juice.  Desserts, such as cakes, cookies, ice cream, and candy. How does alcohol affect me? Alcohol can cause a sudden decrease in blood glucose (hypoglycemia), especially if you use insulin or take certain oral diabetes medicines. Hypoglycemia can be a life-threatening condition. Symptoms of hypoglycemia (sleepiness, dizziness, and confusion) are similar to symptoms of having too much alcohol. If your health care provider says that alcohol is safe for you, follow these guidelines:  Limit alcohol intake to no more than 1 drink per day for nonpregnant women and 2 drinks per day for men. One drink equals 12 oz of beer, 5 oz of wine, or 1 oz of hard liquor.  Do not drink on an empty stomach.  Keep yourself hydrated with water, diet soda, or unsweetened iced tea.  Keep in mind that regular soda, juice, and other mixers may contain a lot of sugar and must be counted as carbs. What are tips for following this plan?  Reading food labels  Start by checking the serving size on the "Nutrition Facts" label of packaged foods and drinks. The amount of calories, carbs, fats, and other nutrients listed on the label is based on one serving of the item. Many items contain more than one serving per package.  Check the total grams (g) of carbs in one serving. You can calculate the number of servings of carbs in one serving by dividing the total carbs by 15. For example, if a food has 30 g of total carbs, it would be equal to 2 servings of carbs.  Check the number of grams (g) of saturated and trans fats in one serving. Choose foods that have low or no amount of these fats.  Check the number of milligrams (mg) of salt (sodium) in one  serving. Most people should limit total sodium intake to less than 2,300 mg per day.  Always check the nutrition information of foods labeled as "low-fat" or "nonfat". These foods may be higher in added sugar or refined carbs and should be avoided.  Talk to your dietitian to identify your daily goals for nutrients listed on the label. Shopping  Avoid buying canned, premade, or processed foods. These foods tend to be high in fat, sodium, and added sugar.  Shop around the outside edge of the grocery store. This includes fresh fruits and vegetables, bulk grains, fresh meats, and fresh dairy. Cooking  Use low-heat cooking methods, such as baking, instead of high-heat cooking methods like deep frying.  Cook using healthy oils, such as olive, canola, or sunflower oil.  Avoid cooking with butter, cream, or high-fat meats. Meal planning  Eat meals and snacks regularly, preferably at the same times every day. Avoid going long periods of time without eating.  Eat foods high in fiber, such as fresh fruits, vegetables, beans, and whole grains. Talk to your dietitian about how many servings of carbs you can eat at each meal.  Eat 4-6 ounces (oz) of lean protein each day, such as lean meat, chicken, fish, eggs, or tofu. One oz of lean protein is equal to: ? 1 oz of meat, chicken, or fish. ? 1 egg. ?  cup of tofu.  Eat some foods each day that contain healthy fats, such as  avocado, nuts, seeds, and fish. Lifestyle  Check your blood glucose regularly.  Exercise regularly as told by your health care provider. This may include: ? 150 minutes of moderate-intensity or vigorous-intensity exercise each week. This could be brisk walking, biking, or water aerobics. ? Stretching and doing strength exercises, such as yoga or weightlifting, at least 2 times a week.  Take medicines as told by your health care provider.  Do not use any products that contain nicotine or tobacco, such as cigarettes and  e-cigarettes. If you need help quitting, ask your health care provider.  Work with a Social worker or diabetes educator to identify strategies to manage stress and any emotional and social challenges. Questions to ask a health care provider  Do I need to meet with a diabetes educator?  Do I need to meet with a dietitian?  What number can I call if I have questions?  When are the best times to check my blood glucose? Where to find more information:  American Diabetes Association: diabetes.org  Academy of Nutrition and Dietetics: www.eatright.[employer] of Diabetes and Digestive and Kidney Diseases (NIH): [employer] Summary  A healthy meal plan will help you control your blood glucose and maintain a healthy lifestyle.  Working with a diet and nutrition specialist (dietitian) can help you make a meal plan that is best for you.  Keep in mind that carbohydrates (carbs) and alcohol have immediate effects on your blood glucose levels. It is important to count carbs and to use alcohol carefully. This information is not intended to replace advice given to you by your health care provider. Make sure you discuss any questions you have with your health care provider. Document Revised: 08/08/2017 Document Reviewed: 09/30/2016 Elsevier Patient Education  2020 Elsevier Inc.      Agustina Caroli, MD [facility]

## 2019-09-23 ENCOUNTER — Encounter: Payer: Self-pay | Admitting: Emergency Medicine

## 2019-09-23 LAB — LIPID PANEL
Chol/HDL Ratio: 3.2 ratio (ref 0.0–5.0)
Cholesterol, Total: 150 mg/dL (ref 100–199)
HDL: 47 mg/dL (ref >39)
LDL Chol Calc (NIH): 77 mg/dL (ref 0–99)
Triglycerides: 151 mg/dL — ABNORMAL HIGH (ref 0–149)
VLDL Cholesterol Cal: 26 mg/dL (ref 5–40)

## 2019-09-23 LAB — COMPREHENSIVE METABOLIC PANEL
ALT: 14 IU/L (ref 0–44)
AST: 18 IU/L (ref 0–40)
Albumin/Globulin Ratio: 1.3 (ref 1.2–2.2)
Albumin: 3.9 g/dL — ABNORMAL LOW (ref 4.0–5.0)
Alkaline Phosphatase: 99 IU/L (ref 39–117)
BUN/Creatinine Ratio: 9 (ref 9–20)
BUN: 8 mg/dL (ref 6–24)
Bilirubin Total: 0.5 mg/dL (ref 0.0–1.2)
CO2: 24 mmol/L (ref 20–29)
Calcium: 9 mg/dL (ref 8.7–10.2)
Chloride: 104 mmol/L (ref 96–106)
Creatinine, Ser: 0.94 mg/dL (ref 0.76–1.27)
GFR calc Af Amer: 115 mL/min/{1.73_m2} (ref >59)
GFR calc non Af Amer: 100 mL/min/{1.73_m2} (ref >59)
Globulin, Total: 3 g/dL (ref 1.5–4.5)
Glucose: 112 mg/dL — ABNORMAL HIGH (ref 65–99)
Potassium: 4.2 mmol/L (ref 3.5–5.2)
Sodium: 140 mmol/L (ref 134–144)
Total Protein: 6.9 g/dL (ref 6.0–8.5)

## 2019-12-29 ENCOUNTER — Ambulatory Visit: Payer: 59 | Admitting: Emergency Medicine

## 2019-12-31 ENCOUNTER — Encounter: Payer: Self-pay | Admitting: Emergency Medicine

## 2020-05-09 ENCOUNTER — Telehealth: Payer: Self-pay | Admitting: Emergency Medicine

## 2020-05-09 NOTE — Telephone Encounter (Signed)
05/09/2020 - PATIENT HAS A PHYSICAL SCHEDULED WITH DR. Irving Shows ON 05/26/2020. WE HAVE TO RESCHEDULE DUE TO DR. MIGUEL BEING OUT OF THE OFFICE. I HAD TO LEAVE A MESSAGE ON HER VOICE MAIL TO RETURN MY CALL TO RESCHEDULE. MBC

## 2020-05-26 ENCOUNTER — Encounter: Payer: 59 | Admitting: Emergency Medicine

## 2020-07-09 ENCOUNTER — Ambulatory Visit (HOSPITAL_COMMUNITY)
Admission: EM | Admit: 2020-07-09 | Discharge: 2020-07-09 | Disposition: A | Discharge status: Home / Self Care | Payer: 59 | Attending: Family Medicine | Admitting: Family Medicine

## 2020-07-09 ENCOUNTER — Encounter (HOSPITAL_COMMUNITY): Payer: Self-pay

## 2020-07-09 DIAGNOSIS — S161XXA Strain of muscle, fascia and tendon at neck level, initial encounter: Secondary | ICD-10-CM

## 2020-07-09 DIAGNOSIS — S39012A Strain of muscle, fascia and tendon of lower back, initial encounter: Secondary | ICD-10-CM

## 2020-07-09 DIAGNOSIS — M545 Low back pain, unspecified: Secondary | ICD-10-CM

## 2020-07-09 DIAGNOSIS — M7918 Myalgia, other site: Secondary | ICD-10-CM

## 2020-07-09 MED ORDER — IBUPROFEN 800 MG PO TABS: 800.0000 mg | ORAL_TABLET | Freq: Three times a day (TID) | ORAL | 0 refills | Status: DC | PRN

## 2020-07-09 MED ORDER — CYCLOBENZAPRINE HCL 10 MG PO TABS: 10.0000 mg | ORAL_TABLET | Freq: Two times a day (BID) | ORAL | 0 refills | Status: DC | PRN
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
## 2020-07-09 NOTE — Discharge Instructions (Signed)
I have sent in ibuprofen for you to take one tablet every 8 hours as needed for pain and inflammation  I have sent in cyclobenzaprine for you to take twice a day as needed for muscle spasms  Follow up with sports medicine if symptoms are persisting  Follow up with this office or with primary care if symptoms are persisting.  Follow up in the ER for high fever, trouble swallowing, trouble breathing, other concerning symptoms.

## 2020-07-12 ENCOUNTER — Other Ambulatory Visit: Payer: Self-pay

## 2020-07-12 ENCOUNTER — Ambulatory Visit: Payer: 59 | Admitting: Registered Nurse

## 2020-07-12 ENCOUNTER — Encounter: Payer: Self-pay | Admitting: Registered Nurse

## 2020-07-12 DIAGNOSIS — E1165 Type 2 diabetes mellitus with hyperglycemia: Secondary | ICD-10-CM

## 2020-07-12 LAB — POCT GLYCOSYLATED HEMOGLOBIN (HGB A1C): Hemoglobin A1C: 6.1 % — AB (ref 4.0–5.6)

## 2020-07-12 MED ORDER — GLUCOSE BLOOD VI STRP: ORAL_STRIP | 12 refills | Status: DC

## 2020-07-12 MED ORDER — METHOCARBAMOL 500 MG PO TABS: 500.0000 mg | ORAL_TABLET | Freq: Four times a day (QID) | ORAL | 0 refills | Status: DC

## 2020-07-12 MED ORDER — DICLOFENAC SODIUM 75 MG PO TBEC: 75.0000 mg | DELAYED_RELEASE_TABLET | Freq: Two times a day (BID) | ORAL | 0 refills | Status: DC
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
## 2020-07-12 NOTE — Patient Instructions (Signed)
° ° ° °  If you have lab work done today you will be contacted with your lab results within the next 2 weeks.  If you have not heard from us then please contact us. The fastest way to get your results is to register for My Chart. ° ° °IF you received an x-ray today, you will receive an invoice from [facility]. Please contact [facility] at [phone] with questions or concerns regarding your invoice.  ° °IF you received labwork today, you will receive an invoice from [facility]. Please contact [facility] at [phone] with questions or concerns regarding your invoice.  ° °Our billing staff will not be able to assist you with questions regarding bills from these companies. ° °You will be contacted with the lab results as soon as they are available. The fastest way to get your results is to activate your My Chart account. Instructions are located on the last page of this paperwork. If you have not heard from us regarding the results in 2 weeks, please contact this office. °  ° ° ° °

## 2020-07-13 ENCOUNTER — Encounter: Payer: Self-pay | Admitting: Registered Nurse
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
## 2020-07-17 ENCOUNTER — Other Ambulatory Visit: Payer: Self-pay

## 2020-07-17 ENCOUNTER — Ambulatory Visit (HOSPITAL_COMMUNITY)
Admission: EM | Admit: 2020-07-17 | Discharge: 2020-07-17 | Disposition: A | Discharge status: Home / Self Care | Payer: 59 | Attending: Internal Medicine | Admitting: Internal Medicine

## 2020-07-17 DIAGNOSIS — Z1152 Encounter for screening for COVID-19: Secondary | ICD-10-CM | POA: Diagnosis not present

## 2020-07-17 DIAGNOSIS — Z20822 Contact with and (suspected) exposure to covid-19: Secondary | ICD-10-CM | POA: Insufficient documentation

## 2020-07-17 LAB — SARS CORONAVIRUS 2 (TAT 6-24 HRS): SARS Coronavirus 2: NEGATIVE

## 2020-07-17 NOTE — ED Triage Notes (Signed)
Pt presents for covid testing with no known symptoms. 

## 2020-07-17 NOTE — Discharge Instructions (Signed)
If your Covid-19 test is positive, you will get a phone call from [facility] regarding your results. If your Covid-19 test is negative, you will NOT get a phone call from [facility] with your results. You may view your results on MyChart. If you do not have a MyChart account, sign up instructions are in your discharge papers. ° °

## 2020-07-19 ENCOUNTER — Ambulatory Visit (INDEPENDENT_AMBULATORY_CARE_PROVIDER_SITE_OTHER): Payer: Self-pay | Admitting: Family Medicine

## 2020-07-19 ENCOUNTER — Other Ambulatory Visit: Payer: Self-pay

## 2020-07-19 ENCOUNTER — Encounter: Payer: Self-pay | Admitting: Family Medicine

## 2020-07-19 VITALS — BP 110/80 | Ht 71.0 in | Wt 216.0 lb

## 2020-07-19 DIAGNOSIS — S39012A Strain of muscle, fascia and tendon of lower back, initial encounter: Secondary | ICD-10-CM

## 2020-07-19 MED ORDER — MELOXICAM 15 MG PO TABS: 15.0000 mg | ORAL_TABLET | Freq: Every day | ORAL | 0 refills | Status: DC
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
## 2020-07-19 NOTE — Patient Instructions (Signed)
Thank you for coming in to see [place] today! Please see below to review our plan for today's visit:   1.   Please start the meloxicam 15 mg every day with breakfast 2.   You may try seeing a chiropractor for your pain 3.   You may also try topical medications, specifically try a lidocaine patch (Salonpas) on the area of pain.  Please plan to follow-up in 1 month for reevaluation, you may call to cancel that appointment if you have resolution of symptoms before then.   Please call the clinic at [phone] if your symptoms worsen or you have any concerns. It was our pleasure to serve you.       Dr. Guy Sandifer [facility]

## 2020-10-11 ENCOUNTER — Other Ambulatory Visit: Payer: Self-pay

## 2020-10-11 ENCOUNTER — Telehealth: Payer: Self-pay | Admitting: *Deleted

## 2020-10-11 ENCOUNTER — Ambulatory Visit (INDEPENDENT_AMBULATORY_CARE_PROVIDER_SITE_OTHER): Payer: 59 | Admitting: Emergency Medicine

## 2020-10-11 ENCOUNTER — Encounter: Payer: Self-pay | Admitting: Emergency Medicine

## 2020-10-11 VITALS — BP 123/78 | HR 66 | Temp 98.4°F | Resp 16 | Ht 71.0 in | Wt 215.0 lb

## 2020-10-11 DIAGNOSIS — Z0001 Encounter for general adult medical examination with abnormal findings: Secondary | ICD-10-CM

## 2020-10-11 DIAGNOSIS — E785 Hyperlipidemia, unspecified: Secondary | ICD-10-CM | POA: Diagnosis not present

## 2020-10-11 DIAGNOSIS — R5383 Other fatigue: Secondary | ICD-10-CM

## 2020-10-11 DIAGNOSIS — E1165 Type 2 diabetes mellitus with hyperglycemia: Secondary | ICD-10-CM

## 2020-10-11 DIAGNOSIS — Z0000 Encounter for general adult medical examination without abnormal findings: Secondary | ICD-10-CM

## 2020-10-11 DIAGNOSIS — E1169 Type 2 diabetes mellitus with other specified complication: Secondary | ICD-10-CM | POA: Diagnosis not present

## 2020-10-11 LAB — POCT GLYCOSYLATED HEMOGLOBIN (HGB A1C): Hemoglobin A1C: 5.9 % — AB (ref 4.0–5.6)

## 2020-10-11 LAB — GLUCOSE, POCT (MANUAL RESULT ENTRY): POC Glucose: 118 mg/dl — AB (ref 70–99)

## 2020-10-11 MED ORDER — GLUCOSE BLOOD VI STRP: ORAL_STRIP | 12 refills | Status: DC

## 2020-10-11 MED ORDER — BLOOD GLUCOSE MONITOR KIT: PACK | 0 refills | Status: DC

## 2020-10-11 MED ORDER — ROSUVASTATIN CALCIUM 20 MG PO TABS: 20.0000 mg | ORAL_TABLET | Freq: Every day | ORAL | 3 refills | Status: DC

## 2020-10-11 NOTE — Patient Instructions (Addendum)
Diabetes Mellitus and Nutrition, Adult When you have diabetes, or diabetes mellitus, it is very important to have healthy eating habits because your blood sugar (glucose) levels are greatly affected by what you eat and drink. Eating healthy foods in the right amounts, at about the same times every day, can help you:  Control your blood glucose.  Lower your risk of heart disease.  Improve your blood pressure.  Reach or maintain a healthy weight. What can affect my meal plan? Every person with diabetes is different, and each person has different needs for a meal plan. Your health care provider may recommend that you work with a dietitian to make a meal plan that is best for you. Your meal plan may vary depending on factors such as:  The calories you need.  The medicines you take.  Your weight.  Your blood glucose, blood pressure, and cholesterol levels.  Your activity level.  Other health conditions you have, such as heart or kidney disease. How do carbohydrates affect me? Carbohydrates, also called carbs, affect your blood glucose level more than any other type of food. Eating carbs naturally raises the amount of glucose in your blood. Carb counting is a method for keeping track of how many carbs you eat. Counting carbs is important to keep your blood glucose at a healthy level, especially if you use insulin or take certain oral diabetes medicines. It is important to know how many carbs you can safely have in each meal. This is different for every person. Your dietitian can help you calculate how many carbs you should have at each meal and for each snack. How does alcohol affect me? Alcohol can cause a sudden decrease in blood glucose (hypoglycemia), especially if you use insulin or take certain oral diabetes medicines. Hypoglycemia can be a life-threatening condition. Symptoms of hypoglycemia, such as sleepiness, dizziness, and confusion, are similar to symptoms of having too much  alcohol.  Do not drink alcohol if: ? Your health care provider tells you not to drink. ? You are pregnant, may be pregnant, or are planning to become pregnant.  If you drink alcohol: ? Do not drink on an empty stomach. ? Limit how much you use to:  0-1 drink a day for women.  0-2 drinks a day for men. ? Be aware of how much alcohol is in your drink. In the [place] one drink equals one 12 oz bottle of beer (355 mL), one 5 oz glass of wine (148 mL), or one 1 oz glass of hard liquor (44 mL). ? Keep yourself hydrated with water, diet soda, or unsweetened iced tea.  Keep in mind that regular soda, juice, and other mixers may contain a lot of sugar and must be counted as carbs. What are tips for following this plan? Reading food labels  Start by checking the serving size on the "Nutrition Facts" label of packaged foods and drinks. The amount of calories, carbs, fats, and other nutrients listed on the label is based on one serving of the item. Many items contain more than one serving per package.  Check the total grams (g) of carbs in one serving. You can calculate the number of servings of carbs in one serving by dividing the total carbs by 15. For example, if a food has 30 g of total carbs per serving, it would be equal to 2 servings of carbs.  Check the number of grams (g) of saturated fats and trans fats in one serving. Choose foods that have   a low amount or none of these fats.  Check the number of milligrams (mg) of salt (sodium) in one serving. Most people should limit total sodium intake to less than 2,300 mg per day.  Always check the nutrition information of foods labeled as "low-fat" or "nonfat." These foods may be higher in added sugar or refined carbs and should be avoided.  Talk to your dietitian to identify your daily goals for nutrients listed on the label. Shopping  Avoid buying canned, pre-made, or processed foods. These foods tend to be high in fat, sodium, and added  sugar.  Shop around the outside edge of the grocery store. This is where you will most often find fresh fruits and vegetables, bulk grains, fresh meats, and fresh dairy. Cooking  Use low-heat cooking methods, such as baking, instead of high-heat cooking methods like deep frying.  Cook using healthy oils, such as olive, canola, or sunflower oil.  Avoid cooking with butter, cream, or high-fat meats. Meal planning  Eat meals and snacks regularly, preferably at the same times every day. Avoid going long periods of time without eating.  Eat foods that are high in fiber, such as fresh fruits, vegetables, beans, and whole grains. Talk with your dietitian about how many servings of carbs you can eat at each meal.  Eat 4-6 oz (112-168 g) of lean protein each day, such as lean meat, chicken, fish, eggs, or tofu. One ounce (oz) of lean protein is equal to: ? 1 oz (28 g) of meat, chicken, or fish. ? 1 egg. ?  cup (62 g) of tofu.  Eat some foods each day that contain healthy fats, such as avocado, nuts, seeds, and fish.   What foods should I eat? Fruits Berries. Apples. Oranges. Peaches. Apricots. Plums. Grapes. Mango. Papaya. Pomegranate. Kiwi. Cherries. Vegetables Lettuce. Spinach. Leafy greens, including kale, chard, collard greens, and mustard greens. Beets. Cauliflower. Cabbage. Broccoli. Carrots. Green beans. Tomatoes. Peppers. Onions. Cucumbers. Brussels sprouts. Grains Whole grains, such as whole-wheat or whole-grain bread, crackers, tortillas, cereal, and pasta. Unsweetened oatmeal. Quinoa. Brown or wild rice. Meats and other proteins Seafood. Poultry without skin. Lean cuts of poultry and beef. Tofu. Nuts. Seeds. Dairy Low-fat or fat-free dairy products such as milk, yogurt, and cheese. The items listed above may not be a complete list of foods and beverages you can eat. Contact a dietitian for more information. What foods should I avoid? Fruits Fruits canned with  syrup. Vegetables Canned vegetables. Frozen vegetables with butter or cream sauce. Grains Refined white flour and flour products such as bread, pasta, snack foods, and cereals. Avoid all processed foods. Meats and other proteins Fatty cuts of meat. Poultry with skin. Breaded or fried meats. Processed meat. Avoid saturated fats. Dairy Full-fat yogurt, cheese, or milk. Beverages Sweetened drinks, such as soda or iced tea. The items listed above may not be a complete list of foods and beverages you should avoid. Contact a dietitian for more information. Questions to ask a health care provider  Do I need to meet with a diabetes educator?  Do I need to meet with a dietitian?  What number can I call if I have questions?  When are the best times to check my blood glucose? Where to find more information:  American Diabetes Association: diabetes.org  Academy of Nutrition and Dietetics: www.eatright.org  National Institute of Diabetes and Digestive and Kidney Diseases: www.niddk.nih.gov  Association of Diabetes Care and Education Specialists: www.diabeteseducator.org Summary  It is important to have healthy eating   habits because your blood sugar (glucose) levels are greatly affected by what you eat and drink.  A healthy meal plan will help you control your blood glucose and maintain a healthy lifestyle.  Your health care provider may recommend that you work with a dietitian to make a meal plan that is best for you.  Keep in mind that carbohydrates (carbs) and alcohol have immediate effects on your blood glucose levels. It is important to count carbs and to use alcohol carefully. This information is not intended to replace advice given to you by your health care provider. Make sure you discuss any questions you have with your health care provider. Document Revised: 08/03/2019 Document Reviewed: 08/03/2019 Elsevier Patient Education  2021 [employer]   Health Maintenance,  Male Adopting a healthy lifestyle and getting preventive care are important in promoting health and wellness. Ask your health care provider about:  The right schedule for you to have regular tests and exams.  Things you can do on your own to prevent diseases and keep yourself healthy. What should I know about diet, weight, and exercise? Eat a healthy diet  Eat a diet that includes plenty of vegetables, fruits, low-fat dairy products, and lean protein.  Do not eat a lot of foods that are high in solid fats, added sugars, or sodium.   Maintain a healthy weight Body mass index (BMI) is a measurement that can be used to identify possible weight problems. It estimates body fat based on height and weight. Your health care provider can help determine your BMI and help you achieve or maintain a healthy weight. Get regular exercise Get regular exercise. This is one of the most important things you can do for your health. Most adults should:  Exercise for at least 150 minutes each week. The exercise should increase your heart rate and make you sweat (moderate-intensity exercise).  Do strengthening exercises at least twice a week. This is in addition to the moderate-intensity exercise.  Spend less time sitting. Even light physical activity can be beneficial. Watch cholesterol and blood lipids Have your blood tested for lipids and cholesterol at 44 years of age, then have this test every 5 years. You may need to have your cholesterol levels checked more often if:  Your lipid or cholesterol levels are high.  You are older than 44 years of age.  You are at high risk for heart disease. What should I know about cancer screening? Many types of cancers can be detected early and may often be prevented. Depending on your health history and family history, you may need to have cancer screening at various ages. This may include screening for:  Colorectal cancer.  Prostate cancer.  Skin  cancer.  Lung cancer. What should I know about heart disease, diabetes, and high blood pressure? Blood pressure and heart disease  High blood pressure causes heart disease and increases the risk of stroke. This is more likely to develop in people who have high blood pressure readings, are of African descent, or are overweight.  Talk with your health care provider about your target blood pressure readings.  Have your blood pressure checked: ? Every 3-5 years if you are 72-69 years of age. ? Every year if you are 61 years old or older.  If you are between the ages of 54 and 72 and are a current or former smoker, ask your health care provider if you should have a one-time screening for abdominal aortic aneurysm (AAA). Diabetes Have regular diabetes  screenings. This checks your fasting blood sugar level. Have the screening done:  Once every three years after age 55 if you are at a normal weight and have a low risk for diabetes.  More often and at a younger age if you are overweight or have a high risk for diabetes. What should I know about preventing infection? Hepatitis B If you have a higher risk for hepatitis B, you should be screened for this virus. Talk with your health care provider to find out if you are at risk for hepatitis B infection. Hepatitis C Blood testing is recommended for:  Everyone born from [birth year] through 1964.  Anyone with known risk factors for hepatitis C. Sexually transmitted infections (STIs)  You should be screened each year for STIs, including gonorrhea and chlamydia, if: ? You are sexually active and are younger than 44 years of age. ? You are older than 44 years of age and your health care provider tells you that you are at risk for this type of infection. ? Your sexual activity has changed since you were last screened, and you are at increased risk for chlamydia or gonorrhea. Ask your health care provider if you are at risk.  Ask your health care provider  about whether you are at high risk for HIV. Your health care provider may recommend a prescription medicine to help prevent HIV infection. If you choose to take medicine to prevent HIV, you should first get tested for HIV. You should then be tested every 3 months for as long as you are taking the medicine. Follow these instructions at home: Lifestyle  Do not use any products that contain nicotine or tobacco, such as cigarettes, e-cigarettes, and chewing tobacco. If you need help quitting, ask your health care provider.  Do not use street drugs.  Do not share needles.  Ask your health care provider for help if you need support or information about quitting drugs. Alcohol use  Do not drink alcohol if your health care provider tells you not to drink.  If you drink alcohol: ? Limit how much you have to 0-2 drinks a day. ? Be aware of how much alcohol is in your drink. In the [place] one drink equals one 12 oz bottle of beer (355 mL), one 5 oz glass of wine (148 mL), or one 1 oz glass of hard liquor (44 mL). General instructions  Schedule regular health, dental, and eye exams.  Stay current with your vaccines.  Tell your health care provider if: ? You often feel depressed. ? You have ever been abused or do not feel safe at home. Summary  Adopting a healthy lifestyle and getting preventive care are important in promoting health and wellness.  Follow your health care provider's instructions about healthy diet, exercising, and getting tested or screened for diseases.  Follow your health care provider's instructions on monitoring your cholesterol and blood pressure. This information is not intended to replace advice given to you by your health care provider. Make sure you discuss any questions you have with your health care provider. Document Revised: 08/19/2018 Document Reviewed: 08/19/2018 Elsevier Patient Education  2021 [employer].

## 2020-10-11 NOTE — Assessment & Plan Note (Signed)
Diabetes much improved with hemoglobin A1c of 5.9 off medications.  Has not taken Metformin or glipizide in several months.  We will keep him off medications for now. Continue rosuvastatin. Diet and nutrition discussed. Follow-up in 3 months.

## 2020-10-11 NOTE — Progress Notes (Signed)
Jason Decker 45 y.o.   Chief Complaint  Patient presents with  . Annual Exam    HISTORY OF PRESENT ILLNESS: This is a 45 y.o. male here for his annual exam. Has history of diabetes presently taking glipizide 5 mg twice a day. History of dyslipidemia on rosuvastatin 20 mg daily. Fully vaccinated against Covid.  Had Covid infection about 6 months ago.  No complications. Inquiring about his testosterone levels. Eating well and exercising regularly. Has no complaints or medical concerns today.  HPI   Prior to Admission medications   Medication Sig Start Date End Date Taking? Authorizing Provider  blood glucose meter kit and supplies KIT Dispense based on patient and insurance preference. Use up to four times daily as directed. (FOR ICD-9 250.00, 250.01). 09/22/19  Yes Eurika Sandy, Ines Bloomer, MD  diphenhydrAMINE (BENADRYL) 25 mg capsule Take 50 mg by mouth every 6 (six) hours as needed.   Yes [provider]  EPINEPHrine (AUVI-Q) 0.3 mg/0.3 mL IJ SOAJ injection Inject 0.3 mLs (0.3 mg total) into the muscle as needed for anaphylaxis. 04/26/19  Yes Garnet Sierras, DO  ibuprofen (ADVIL) 800 MG tablet Take 1 tablet (800 mg total) by mouth every 8 (eight) hours as needed for moderate pain. 07/09/20  Yes Faustino Congress, NP  metFORMIN (GLUCOPHAGE) 500 MG tablet Take 1 tablet (500 mg total) by mouth 2 (two) times daily with a meal. 09/22/19  Yes Kden Wagster, Ines Bloomer, MD  methocarbamol (ROBAXIN) 500 MG tablet Take 1 tablet (500 mg total) by mouth 4 (four) times daily. 07/12/20  Yes Maximiano Coss, NP  rosuvastatin (CRESTOR) 20 MG tablet Take 1 tablet (20 mg total) by mouth daily. 09/22/19  Yes Deeanna Beightol, Ines Bloomer, MD  cyclobenzaprine (FLEXERIL) 10 MG tablet Take 1 tablet (10 mg total) by mouth 2 (two) times daily as needed for muscle spasms. Patient not taking: Reported on 10/11/2020 07/09/20   Faustino Congress, NP  diclofenac (VOLTAREN) 75 MG EC tablet Take 1 tablet (75 mg total) by  mouth 2 (two) times daily. Patient not taking: Reported on 10/11/2020 07/12/20   Maximiano Coss, NP  glipiZIDE (GLUCOTROL) 5 MG tablet Take 1 tablet (5 mg total) by mouth 2 (two) times daily before a meal. 06/01/19 08/30/19  Horald Pollen, MD  glucose blood test strip Use as instructed 07/12/20   Maximiano Coss, NP  levocetirizine (XYZAL) 5 MG tablet Take 1 tablet (5 mg total) by mouth every evening. Patient not taking: Reported on 10/11/2020 04/23/19   Garnet Sierras, DO  meloxicam (MOBIC) 15 MG tablet Take 1 tablet (15 mg total) by mouth daily. Patient not taking: Reported on 10/11/2020 07/19/20   Dene Gentry, MD    Allergies  Allergen Reactions  . Shellfish Allergy Anaphylaxis    Patient Active Problem List   Diagnosis Date Noted  . Dyslipidemia associated with type 2 diabetes mellitus ([place]) 06/01/2019  . Dyslipidemia 06/01/2019    History reviewed. No pertinent past medical history.  History reviewed. No pertinent surgical history.  Social History   Socioeconomic History  . Marital status: Single    Spouse name: Not on file  . Number of children: Not on file  . Years of education: Not on file  . Highest education level: Not on file  Occupational History  . Not on file  Tobacco Use  . Smoking status: Former Research scientist (life sciences)  . Smokeless tobacco: Never Used  Vaping Use  . Vaping Use: Never used  Substance and Sexual Activity  . Alcohol  use: Yes  . Drug use: Not on file  . Sexual activity: Not on file  Other Topics Concern  . Not on file  Social History Narrative   ** Merged History Encounter **       Social Determinants of Health   Financial Resource Strain: Not on file  Food Insecurity: Not on file  Transportation Needs: Not on file  Physical Activity: Not on file  Stress: Not on file  Social Connections: Not on file  Intimate Partner Violence: Not on file    History reviewed. No pertinent family history.   Review of Systems  Constitutional: Negative.   Negative for chills and fever.  HENT: Negative.  Negative for congestion and sore throat.   Respiratory: Negative.  Negative for cough and shortness of breath.   Cardiovascular: Negative.  Negative for chest pain and palpitations.  Gastrointestinal: Negative for abdominal pain, diarrhea, nausea and vomiting.  Genitourinary: Negative.  Negative for dysuria.  Musculoskeletal: Negative.  Negative for myalgias and neck pain.  Skin: Negative.  Negative for rash.  Neurological: Negative.  Negative for dizziness and headaches.  All other systems reviewed and are negative.   Today's Vitals   10/11/20 0927  BP: 123/78  Pulse: 66  Resp: 16  Temp: 98.4 F (36.9 C)  TempSrc: Temporal  SpO2: 97%  Weight: 215 lb (97.5 kg)  Height: _0  (1.803 m)   Body mass index is 29.99 kg/m. Wt Readings from Last 3 Encounters:  10/11/20 215 lb (97.5 kg)  07/19/20 216 lb (98 kg)  07/12/20 216 lb 6.4 oz (98.2 kg)    Physical Exam Vitals reviewed.  Constitutional:      Appearance: Normal appearance.  HENT:     Head: Normocephalic.     Mouth/Throat:     Mouth: Mucous membranes are moist.     Pharynx: Oropharynx is clear.  Eyes:     Extraocular Movements: Extraocular movements intact.     Conjunctiva/sclera: Conjunctivae normal.     Pupils: Pupils are equal, round, and reactive to light.  Cardiovascular:     Rate and Rhythm: Normal rate and regular rhythm.     Pulses: Normal pulses.     Heart sounds: Normal heart sounds.  Pulmonary:     Effort: Pulmonary effort is normal.     Breath sounds: Normal breath sounds.  Abdominal:     General: There is no distension.     Palpations: Abdomen is soft. There is no mass.     Tenderness: There is no abdominal tenderness.  Musculoskeletal:        General: Normal range of motion.     Cervical back: Normal range of motion and neck supple.  Skin:    General: Skin is warm and dry.     Capillary Refill: Capillary refill takes less than 2 seconds.   Neurological:     General: No focal deficit present.     Mental Status: He is alert and oriented to person, place, and time.  Psychiatric:        Mood and Affect: Mood normal.        Behavior: Behavior normal.    Results for orders placed or performed in visit on 10/11/20 (from the past 24 hour(s))  POCT glucose (manual entry)     Status: Abnormal   Collection Time: 10/11/20  9:37 AM  Result Value Ref Range   POC Glucose 118 (A) 70 - 99 mg/dl  POCT glycosylated hemoglobin (Hb A1C)     Status:  Abnormal   Collection Time: 10/11/20  9:42 AM  Result Value Ref Range   Hemoglobin A1C 5.9 (A) 4.0 - 5.6 %   HbA1c POC (<> result, manual entry)     HbA1c, POC (prediabetic range)     HbA1c, POC (controlled diabetic range)       ASSESSMENT & PLAN: Dyslipidemia associated with type 2 diabetes mellitus ([place]) Diabetes much improved with hemoglobin A1c of 5.9 off medications.  Has not taken Metformin or glipizide in several months.  We will keep him off medications for now. Continue rosuvastatin. Diet and nutrition discussed. Follow-up in 3 months.  Eden was seen today for annual exam.  Diagnoses and all orders for this visit:  Routine general medical examination at a health care facility  Dyslipidemia associated with type 2 diabetes mellitus ([place]) -     Comprehensive metabolic panel -     Lipid panel -     POCT glucose (manual entry) -     POCT glycosylated hemoglobin (Hb A1C) -     rosuvastatin (CRESTOR) 20 MG tablet; Take 1 tablet (20 mg total) by mouth daily. -     blood glucose meter kit and supplies KIT; Dispense based on patient and insurance preference. Use up to four times daily as directed. (FOR ICD-9 250.00, 250.01). -     glucose blood test strip; Use as instructed -     Ambulatory referral to Ophthalmology -     HM Diabetes Foot Exam  Low energy -     TestT+TestF+SHBG    Patient Instructions   Diabetes Mellitus and Nutrition, Adult When you have diabetes, or  diabetes mellitus, it is very important to have healthy eating habits because your blood sugar (glucose) levels are greatly affected by what you eat and drink. Eating healthy foods in the right amounts, at about the same times every day, can help you:  Control your blood glucose.  Lower your risk of heart disease.  Improve your blood pressure.  Reach or maintain a healthy weight. What can affect my meal plan? Every person with diabetes is different, and each person has different needs for a meal plan. Your health care provider may recommend that you work with a dietitian to make a meal plan that is best for you. Your meal plan may vary depending on factors such as:  The calories you need.  The medicines you take.  Your weight.  Your blood glucose, blood pressure, and cholesterol levels.  Your activity level.  Other health conditions you have, such as heart or kidney disease. How do carbohydrates affect me? Carbohydrates, also called carbs, affect your blood glucose level more than any other type of food. Eating carbs naturally raises the amount of glucose in your blood. Carb counting is a method for keeping track of how many carbs you eat. Counting carbs is important to keep your blood glucose at a healthy level, especially if you use insulin or take certain oral diabetes medicines. It is important to know how many carbs you can safely have in each meal. This is different for every person. Your dietitian can help you calculate how many carbs you should have at each meal and for each snack. How does alcohol affect me? Alcohol can cause a sudden decrease in blood glucose (hypoglycemia), especially if you use insulin or take certain oral diabetes medicines. Hypoglycemia can be a life-threatening condition. Symptoms of hypoglycemia, such as sleepiness, dizziness, and confusion, are similar to symptoms of having too much alcohol.  Do not drink alcohol if: ? Your health care provider tells  you not to drink. ? You are pregnant, may be pregnant, or are planning to become pregnant.  If you drink alcohol: ? Do not drink on an empty stomach. ? Limit how much you use to:  0-1 drink a day for women.  0-2 drinks a day for men. ? Be aware of how much alcohol is in your drink. In the [place] one drink equals one 12 oz bottle of beer (355 mL), one 5 oz glass of wine (148 mL), or one 1 oz glass of hard liquor (44 mL). ? Keep yourself hydrated with water, diet soda, or unsweetened iced tea.  Keep in mind that regular soda, juice, and other mixers may contain a lot of sugar and must be counted as carbs. What are tips for following this plan? Reading food labels  Start by checking the serving size on the "Nutrition Facts" label of packaged foods and drinks. The amount of calories, carbs, fats, and other nutrients listed on the label is based on one serving of the item. Many items contain more than one serving per package.  Check the total grams (g) of carbs in one serving. You can calculate the number of servings of carbs in one serving by dividing the total carbs by 15. For example, if a food has 30 g of total carbs per serving, it would be equal to 2 servings of carbs.  Check the number of grams (g) of saturated fats and trans fats in one serving. Choose foods that have a low amount or none of these fats.  Check the number of milligrams (mg) of salt (sodium) in one serving. Most people should limit total sodium intake to less than 2,300 mg per day.  Always check the nutrition information of foods labeled as "low-fat" or "nonfat." These foods may be higher in added sugar or refined carbs and should be avoided.  Talk to your dietitian to identify your daily goals for nutrients listed on the label. Shopping  Avoid buying canned, pre-made, or processed foods. These foods tend to be high in fat, sodium, and added sugar.  Shop around the outside edge of the grocery store. This is where you  will most often find fresh fruits and vegetables, bulk grains, fresh meats, and fresh dairy. Cooking  Use low-heat cooking methods, such as baking, instead of high-heat cooking methods like deep frying.  Cook using healthy oils, such as olive, canola, or sunflower oil.  Avoid cooking with butter, cream, or high-fat meats. Meal planning  Eat meals and snacks regularly, preferably at the same times every day. Avoid going long periods of time without eating.  Eat foods that are high in fiber, such as fresh fruits, vegetables, beans, and whole grains. Talk with your dietitian about how many servings of carbs you can eat at each meal.  Eat 4-6 oz (112-168 g) of lean protein each day, such as lean meat, chicken, fish, eggs, or tofu. One ounce (oz) of lean protein is equal to: ? 1 oz (28 g) of meat, chicken, or fish. ? 1 egg. ?  cup (62 g) of tofu.  Eat some foods each day that contain healthy fats, such as avocado, nuts, seeds, and fish.   What foods should I eat? Fruits Berries. Apples. Oranges. Peaches. Apricots. Plums. Grapes. Mango. Papaya. Pomegranate. Kiwi. Cherries. Vegetables Lettuce. Spinach. Leafy greens, including kale, chard, collard greens, and mustard greens. Beets. Cauliflower. Cabbage. Broccoli. Carrots. Green beans.  Tomatoes. Peppers. Onions. Cucumbers. Brussels sprouts. Grains Whole grains, such as whole-wheat or whole-grain bread, crackers, tortillas, cereal, and pasta. Unsweetened oatmeal. Quinoa. Brown or wild rice. Meats and other proteins Seafood. Poultry without skin. Lean cuts of poultry and beef. Tofu. Nuts. Seeds. Dairy Low-fat or fat-free dairy products such as milk, yogurt, and cheese. The items listed above may not be a complete list of foods and beverages you can eat. Contact a dietitian for more information. What foods should I avoid? Fruits Fruits canned with syrup. Vegetables Canned vegetables. Frozen vegetables with butter or cream  sauce. Grains Refined white flour and flour products such as bread, pasta, snack foods, and cereals. Avoid all processed foods. Meats and other proteins Fatty cuts of meat. Poultry with skin. Breaded or fried meats. Processed meat. Avoid saturated fats. Dairy Full-fat yogurt, cheese, or milk. Beverages Sweetened drinks, such as soda or iced tea. The items listed above may not be a complete list of foods and beverages you should avoid. Contact a dietitian for more information. Questions to ask a health care provider  Do I need to meet with a diabetes educator?  Do I need to meet with a dietitian?  What number can I call if I have questions?  When are the best times to check my blood glucose? Where to find more information:  American Diabetes Association: diabetes.org  Academy of Nutrition and Dietetics: www.eatright.[employer] of Diabetes and Digestive and Kidney Diseases: [employer]  Association of Diabetes Care and Education Specialists: www.diabeteseducator.org Summary  It is important to have healthy eating habits because your blood sugar (glucose) levels are greatly affected by what you eat and drink.  A healthy meal plan will help you control your blood glucose and maintain a healthy lifestyle.  Your health care provider may recommend that you work with a dietitian to make a meal plan that is best for you.  Keep in mind that carbohydrates (carbs) and alcohol have immediate effects on your blood glucose levels. It is important to count carbs and to use alcohol carefully. This information is not intended to replace advice given to you by your health care provider. Make sure you discuss any questions you have with your health care provider. Document Revised: 08/03/2019 Document Reviewed: 08/03/2019 Elsevier Patient Education  2021 [place] Maintenance, Male Adopting a healthy lifestyle and getting preventive care are important in  promoting health and wellness. Ask your health care provider about:  The right schedule for you to have regular tests and exams.  Things you can do on your own to prevent diseases and keep yourself healthy. What should I know about diet, weight, and exercise? Eat a healthy diet  Eat a diet that includes plenty of vegetables, fruits, low-fat dairy products, and lean protein.  Do not eat a lot of foods that are high in solid fats, added sugars, or sodium.   Maintain a healthy weight Body mass index (BMI) is a measurement that can be used to identify possible weight problems. It estimates body fat based on height and weight. Your health care provider can help determine your BMI and help you achieve or maintain a healthy weight. Get regular exercise Get regular exercise. This is one of the most important things you can do for your health. Most adults should:  Exercise for at least 150 minutes each week. The exercise should increase your heart rate and make you sweat (moderate-intensity exercise).  Do strengthening exercises at least twice a week.  This is in addition to the moderate-intensity exercise.  Spend less time sitting. Even light physical activity can be beneficial. Watch cholesterol and blood lipids Have your blood tested for lipids and cholesterol at 44 years of age, then have this test every 5 years. You may need to have your cholesterol levels checked more often if:  Your lipid or cholesterol levels are high.  You are older than 44 years of age.  You are at high risk for heart disease. What should I know about cancer screening? Many types of cancers can be detected early and may often be prevented. Depending on your health history and family history, you may need to have cancer screening at various ages. This may include screening for:  Colorectal cancer.  Prostate cancer.  Skin cancer.  Lung cancer. What should I know about heart disease, diabetes, and high blood  pressure? Blood pressure and heart disease  High blood pressure causes heart disease and increases the risk of stroke. This is more likely to develop in people who have high blood pressure readings, are of African descent, or are overweight.  Talk with your health care provider about your target blood pressure readings.  Have your blood pressure checked: ? Every 3-5 years if you are 41-33 years of age. ? Every year if you are 56 years old or older.  If you are between the ages of 32 and 61 and are a current or former smoker, ask your health care provider if you should have a one-time screening for abdominal aortic aneurysm (AAA). Diabetes Have regular diabetes screenings. This checks your fasting blood sugar level. Have the screening done:  Once every three years after age 41 if you are at a normal weight and have a low risk for diabetes.  More often and at a younger age if you are overweight or have a high risk for diabetes. What should I know about preventing infection? Hepatitis B If you have a higher risk for hepatitis B, you should be screened for this virus. Talk with your health care provider to find out if you are at risk for hepatitis B infection. Hepatitis C Blood testing is recommended for:  Everyone born from [birth year] through 1964.  Anyone with known risk factors for hepatitis C. Sexually transmitted infections (STIs)  You should be screened each year for STIs, including gonorrhea and chlamydia, if: ? You are sexually active and are younger than 44 years of age. ? You are older than 44 years of age and your health care provider tells you that you are at risk for this type of infection. ? Your sexual activity has changed since you were last screened, and you are at increased risk for chlamydia or gonorrhea. Ask your health care provider if you are at risk.  Ask your health care provider about whether you are at high risk for HIV. Your health care provider may recommend a  prescription medicine to help prevent HIV infection. If you choose to take medicine to prevent HIV, you should first get tested for HIV. You should then be tested every 3 months for as long as you are taking the medicine. Follow these instructions at home: Lifestyle  Do not use any products that contain nicotine or tobacco, such as cigarettes, e-cigarettes, and chewing tobacco. If you need help quitting, ask your health care provider.  Do not use street drugs.  Do not share needles.  Ask your health care provider for help if you need support or information about  quitting drugs. Alcohol use  Do not drink alcohol if your health care provider tells you not to drink.  If you drink alcohol: ? Limit how much you have to 0-2 drinks a day. ? Be aware of how much alcohol is in your drink. In the [place] one drink equals one 12 oz bottle of beer (355 mL), one 5 oz glass of wine (148 mL), or one 1 oz glass of hard liquor (44 mL). General instructions  Schedule regular health, dental, and eye exams.  Stay current with your vaccines.  Tell your health care provider if: ? You often feel depressed. ? You have ever been abused or do not feel safe at home. Summary  Adopting a healthy lifestyle and getting preventive care are important in promoting health and wellness.  Follow your health care provider's instructions about healthy diet, exercising, and getting tested or screened for diseases.  Follow your health care provider's instructions on monitoring your cholesterol and blood pressure. This information is not intended to replace advice given to you by your health care provider. Make sure you discuss any questions you have with your health care provider. Document Revised: 08/19/2018 Document Reviewed: 08/19/2018 Elsevier Patient Education  2021 Elsevier Inc.      Agustina Caroli, MD [facility]

## 2020-10-11 NOTE — Telephone Encounter (Signed)
Faxed Rx for blood glucose meter kit and supplies to [facility]. Confirmation 11:49 am.

## 2020-10-13 LAB — COMPREHENSIVE METABOLIC PANEL
ALT: 22 IU/L (ref 0–44)
AST: 21 IU/L (ref 0–40)
Albumin/Globulin Ratio: 1.3 (ref 1.2–2.2)
Albumin: 4.1 g/dL (ref 4.0–5.0)
Alkaline Phosphatase: 106 IU/L (ref 44–121)
BUN/Creatinine Ratio: 9 (ref 9–20)
BUN: 10 mg/dL (ref 6–24)
Bilirubin Total: 0.5 mg/dL (ref 0.0–1.2)
CO2: 25 mmol/L (ref 20–29)
Calcium: 9.3 mg/dL (ref 8.7–10.2)
Chloride: 103 mmol/L (ref 96–106)
Creatinine, Ser: 1.13 mg/dL (ref 0.76–1.27)
GFR calc Af Amer: 92 mL/min/{1.73_m2} (ref >59)
GFR calc non Af Amer: 79 mL/min/{1.73_m2} (ref >59)
Globulin, Total: 3.1 g/dL (ref 1.5–4.5)
Glucose: 109 mg/dL — ABNORMAL HIGH (ref 65–99)
Potassium: 4.5 mmol/L (ref 3.5–5.2)
Sodium: 139 mmol/L (ref 134–144)
Total Protein: 7.2 g/dL (ref 6.0–8.5)

## 2020-10-13 LAB — LIPID PANEL
Chol/HDL Ratio: 3.8 ratio (ref 0.0–5.0)
Cholesterol, Total: 175 mg/dL (ref 100–199)
HDL: 46 mg/dL (ref >39)
LDL Chol Calc (NIH): 111 mg/dL — ABNORMAL HIGH (ref 0–99)
Triglycerides: 97 mg/dL (ref 0–149)
VLDL Cholesterol Cal: 18 mg/dL (ref 5–40)

## 2020-10-13 LAB — TESTT+TESTF+SHBG
Sex Hormone Binding: 32.4 nmol/L (ref 16.5–55.9)
Testosterone, Free: 9.8 pg/mL (ref 6.8–21.5)
Testosterone, Total, LC/MS: 435.5 ng/dL (ref 264.0–916.0)

## 2020-12-19 ENCOUNTER — Telehealth (INDEPENDENT_AMBULATORY_CARE_PROVIDER_SITE_OTHER): Payer: 59 | Admitting: Emergency Medicine

## 2020-12-19 ENCOUNTER — Encounter: Payer: Self-pay | Admitting: Emergency Medicine

## 2020-12-19 DIAGNOSIS — K529 Noninfective gastroenteritis and colitis, unspecified: Secondary | ICD-10-CM

## 2020-12-19 NOTE — Progress Notes (Signed)
Telemedicine Encounter- SOAP NOTE Established Patient MyChart video encounter This video encounter was conducted with the patient's (or proxy's) verbal consent via video telecommunications: yes/no: Yes Patient was instructed to have this encounter in a suitably private space; and to only have persons present to whom they give permission to participate. In addition, patient identity was confirmed by use of name plus two identifiers (DOB and address).  I discussed the limitations, risks, security and privacy concerns of performing an evaluation and management service by telephone and the availability of in person appointments. I also discussed with the patient that there may be a patient responsible charge related to this service. The patient expressed understanding and agreed to proceed.  I spent a total of TIME; 0 MIN TO 60 MIN: 20 minutes talking with the patient or their proxy.  No chief complaint on file. Nausea vomiting and diarrhea since last Friday  Subjective   Jason Decker is a 45 y.o. male established patient. Telephone visit today complaining of nausea, vomiting, feeling cold, chills, feeling weak with nausea vomiting and diarrhea that started 4 days ago.  Feeling better today.  No longer vomiting.  Still having some diarrhea.  Able to stay hydrated.  Was able to eat some chicken wings today but abdominal cramping and diarrhea came back after that. No other complaints or medical concerns today.  HPI   Patient Active Problem List   Diagnosis Date Noted  . Dyslipidemia associated with type 2 diabetes mellitus ([place]) 06/01/2019  . Dyslipidemia 06/01/2019    No past medical history on file.  Current Outpatient Medications  Medication Sig Dispense Refill  . blood glucose meter kit and supplies KIT Dispense based on patient and insurance preference. Use up to four times daily as directed. (FOR ICD-9 250.00, 250.01). 1 each 0  . cyclobenzaprine (FLEXERIL) 10 MG tablet Take 1  tablet (10 mg total) by mouth 2 (two) times daily as needed for muscle spasms. (Patient not taking: Reported on 10/11/2020) 20 tablet 0  . diclofenac (VOLTAREN) 75 MG EC tablet Take 1 tablet (75 mg total) by mouth 2 (two) times daily. (Patient not taking: Reported on 10/11/2020) 30 tablet 0  . diphenhydrAMINE (BENADRYL) 25 mg capsule Take 50 mg by mouth every 6 (six) hours as needed.    Marland Kitchen EPINEPHrine (AUVI-Q) 0.3 mg/0.3 mL IJ SOAJ injection Inject 0.3 mLs (0.3 mg total) into the muscle as needed for anaphylaxis. 2 each 2  . glipiZIDE (GLUCOTROL) 5 MG tablet Take 1 tablet (5 mg total) by mouth 2 (two) times daily before a meal. 180 tablet 1  . glucose blood test strip Use as instructed 100 each 12  . ibuprofen (ADVIL) 800 MG tablet Take 1 tablet (800 mg total) by mouth every 8 (eight) hours as needed for moderate pain. 21 tablet 0  . levocetirizine (XYZAL) 5 MG tablet Take 1 tablet (5 mg total) by mouth every evening. (Patient not taking: Reported on 10/11/2020) 90 tablet 1  . meloxicam (MOBIC) 15 MG tablet Take 1 tablet (15 mg total) by mouth daily. (Patient not taking: Reported on 10/11/2020) 30 tablet 0  . metFORMIN (GLUCOPHAGE) 500 MG tablet Take 1 tablet (500 mg total) by mouth 2 (two) times daily with a meal. 180 tablet 3  . methocarbamol (ROBAXIN) 500 MG tablet Take 1 tablet (500 mg total) by mouth 4 (four) times daily. 60 tablet 0  . rosuvastatin (CRESTOR) 20 MG tablet Take 1 tablet (20 mg total) by mouth daily. 90 tablet 3  No current facility-administered medications for this visit.    Allergies  Allergen Reactions  . Shellfish Allergy Anaphylaxis    Social History   Socioeconomic History  . Marital status: Single    Spouse name: Not on file  . Number of children: Not on file  . Years of education: Not on file  . Highest education level: Not on file  Occupational History  . Not on file  Tobacco Use  . Smoking status: Former Research scientist (life sciences)  . Smokeless tobacco: Never Used  Vaping Use  .  Vaping Use: Never used  Substance and Sexual Activity  . Alcohol use: Yes  . Drug use: Not on file  . Sexual activity: Not on file  Other Topics Concern  . Not on file  Social History Narrative   ** Merged History Encounter **       Social Determinants of Health   Financial Resource Strain: Not on file  Food Insecurity: Not on file  Transportation Needs: Not on file  Physical Activity: Not on file  Stress: Not on file  Social Connections: Not on file  Intimate Partner Violence: Not on file    Review of Systems  Constitutional: Positive for chills. Negative for fever.  HENT: Negative.  Negative for congestion and sore throat.   Respiratory: Negative.  Negative for cough and shortness of breath.   Cardiovascular: Negative.  Negative for chest pain and palpitations.  Gastrointestinal: Positive for abdominal pain, diarrhea, nausea and vomiting. Negative for blood in stool and melena.  Genitourinary: Negative.  Negative for dysuria and hematuria.  Skin: Negative.  Negative for rash.  Neurological: Negative.  Negative for dizziness and headaches.  All other systems reviewed and are negative.   Objective  Alert and oriented x3 in no apparent respiratory distress. Vitals as reported by the patient: There were no vitals filed for this visit.  There are no diagnoses linked to this encounter. Clinically stable and progressively getting better.  No red flag signs or symptoms. Continue taking Imodium as needed.  Brat diet recommended. Continue to stay well-hydrated. Advised to contact the office if no better or worse during the next several days. Diagnoses and all orders for this visit:  Acute gastroenteritis     I discussed the assessment and treatment plan with the patient. The patient was provided an opportunity to ask questions and all were answered. The patient agreed with the plan and demonstrated an understanding of the instructions.   The patient was advised to call  back or seek an in-person evaluation if the symptoms worsen or if the condition fails to improve as anticipated.  I provided 20 minutes of non-face-to-face time during this encounter.  Horald Pollen, MD  Primary Care at [facility]

## 2021-01-10 ENCOUNTER — Ambulatory Visit: Payer: 59 | Admitting: Emergency Medicine

## 2021-08-26 ENCOUNTER — Ambulatory Visit (HOSPITAL_COMMUNITY)
Admission: EM | Admit: 2021-08-26 | Discharge: 2021-08-26 | Disposition: A | Discharge status: Home / Self Care | Payer: 59 | Attending: Family | Admitting: Family

## 2021-08-26 DIAGNOSIS — F411 Generalized anxiety disorder: Secondary | ICD-10-CM | POA: Diagnosis not present

## 2021-08-26 MED ORDER — HYDROXYZINE HCL 25 MG PO TABS: 25.0000 mg | ORAL_TABLET | Freq: Three times a day (TID) | ORAL | 0 refills | Status: DC | PRN

## 2021-08-26 NOTE — Discharge Instructions (Signed)
Take all medications as prescribed. Keep all follow-up appointments as scheduled.  Do not consume alcohol or use illegal drugs while on prescription medications. Report any adverse effects from your medications to your primary care provider promptly.  In the event of recurrent symptoms or worsening symptoms, call 911, a crisis hotline, or go to the nearest emergency department for evaluation.   

## 2021-08-26 NOTE — ED Provider Notes (Addendum)
Behavioral Health Urgent Care Medical Screening Exam  Patient Name: Jason Decker MRN: [id number] Date of Evaluation: 08/26/21 Chief Complaint: "I had an anxiety attack last night" Diagnosis:  Final diagnoses:  Generalized anxiety disorder    History of Present illness: Jason Decker is a 45 y.o. male presents [facility] urgent care voluntary.  Patient reports he recently had a anxiety/panic attack last night.  States it was unprovoked.  Does report a history of trauma however does not feel as if that attributed to his symptoms on last night. "  Everything is going well in my life, but around this time of the year I get a little anxious."  Does report a recent job promotion which may be attributed to underlying stress and feelings of being overwhelmed.  He denies suicidal or homicidal ideations.  Denies auditory visual hallucinations.  Denies previous inpatient admissions.  Denies substance abuse history.  Reports drinking occasionally.  Denied history with mental illness.  Patient is requesting additional outpatient resources for therapy and psychiatry.  Discussed initiating hydroxyzine 25 mg p.o. as needed 3 times daily.  Patient was receptive to plan.  Support encouragement reassurance was provided.   [name], 45 y.o., male patient seen face to face by this provider and chart reviewed on 08/26/21.  On evaluation [name]   During evaluation [name] is sitting in no acute distress. He is alert/oriented x 4; calm/cooperative; and mood congruent with affect.  He is speaking in a clear tone at moderate volume, and normal pace; with good eye contact. His thought process is coherent and relevant; There is no indication that he is currently responding to internal/external stimuli or experiencing delusional thought content; and he has denied suicidal/self-harm/homicidal ideation, psychosis, and paranoia.   Patient has remained calm throughout assessment and has answered questions  appropriately.     At this time [name] is educated and verbalizes understanding of mental health resources and other crisis services in the community. He is instructed to call 911 and present to the nearest emergency room should he experience any suicidal/homicidal ideation, auditory/visual/hallucinations, or detrimental worsening of his mental health condition.  He was a also advised by Clinical research associate that he could call the toll-free phone on insurance card to assist with identifying in network counselors and agencies or number on back of Medicaid card to speak with care coordinator.    Psychiatric Specialty Exam  Presentation  General Appearance:Appropriate for Environment  Eye Contact:Good  Speech:Clear and Coherent  Speech Volume:Normal  Handedness:Right   Mood and Affect  Mood:Anxious  Affect:Congruent   Thought Process  Thought Processes:Coherent  Descriptions of Associations:Intact  Orientation:Full (Time, Place and Person)  Thought Content:Logical    Hallucinations:None  Ideas of Reference:None  Suicidal Thoughts:No  Homicidal Thoughts:No   Sensorium  Memory:Remote Good; Recent Good; Immediate Good  Judgment:Fair  Insight:Good   Executive Functions  Concentration:Good  Attention Span:Good  Recall:Good  Fund of Knowledge:Good  Language:Good   Psychomotor Activity  Psychomotor Activity:Normal   Assets  Assets:Desire for Improvement   Sleep  Sleep:Good  Number of hours: No data recorded  Nutritional Assessment (For OBS and [facility] admissions only) Has the patient had a weight loss or gain of 10 pounds or more in the last 3 months?: No Has the patient had a decrease in food intake/or appetite?: No Does the patient have dental problems?: No Does the patient have eating habits or behaviors that may be indicators of an eating disorder including binging or inducing vomiting?: No Has the  patient recently lost weight without trying?: 0 Has the  patient been eating poorly because of a decreased appetite?: 0 Malnutrition Screening Tool Score: 0    Physical Exam: Physical Exam Cardiovascular:     Rate and Rhythm: Normal rate.     Pulses: Normal pulses.  Neurological:     Mental Status: He is alert and oriented to person, place, and time.  Psychiatric:        Attention and Perception: Attention and perception normal.        Mood and Affect: Mood normal.        Speech: Speech normal.        Behavior: Behavior normal.        Thought Content: Thought content normal.        Cognition and Memory: Cognition normal.   Review of Systems  Cardiovascular: Negative.   Gastrointestinal: Negative.   Neurological: Negative.   Psychiatric/Behavioral:  Negative for depression. The patient is nervous/anxious.   All other systems reviewed and are negative. Blood pressure 133/75, pulse (!) 105, temperature 98.7 F (37.1 C), temperature source Oral, resp. rate 18, SpO2 99 %. There is no height or weight on file to calculate BMI.  Musculoskeletal: Strength & Muscle Tone: within normal limits Gait & Station: normal Patient leans: N/A   [facility] MSE Discharge Disposition for Follow up and Recommendations: Based on my evaluation the patient does not appear to have an emergency medical condition and can be discharged with resources and follow up care in outpatient services for Medication Management and Individual Therapy  Patient was provided with prescription for hydroxyzine 25 mg p.o. as needed 3 times daily Additional outpatient resources for therapy and psychiatry provided  [name], NP 08/26/2021, 2:43 PM

## 2021-08-26 NOTE — Progress Notes (Signed)
Patient is a 45 year old male that presents this date concerned in reference to what he feels was an anxiety attack last night around 1800 hours. Patient reports a sudden onset of symptoms to include racing heart, hyperventilating and loss of energy for the rest of the evening. Patient states this episode last 1 hour or more and self resolved. Patient states he "only slept a couple hours last night" due to being concerned over that event. Patient denies any S/I, H/I or AVH. Patient denies any SA issues aside from social alcohol use. Patient denies having any prior mental health disorder. Patient cannot identify any immediate stressors although states he "always gets depressed this time of year." Patient is married and resides with wife and children at home. Patient is concerned this anxiety may return and is interested is possible therapy or medication recommendations.

## 2021-08-27 ENCOUNTER — Encounter (HOSPITAL_BASED_OUTPATIENT_CLINIC_OR_DEPARTMENT_OTHER): Payer: Self-pay

## 2021-08-27 ENCOUNTER — Emergency Department (HOSPITAL_BASED_OUTPATIENT_CLINIC_OR_DEPARTMENT_OTHER)
Admission: EM | Admit: 2021-08-27 | Discharge: 2021-08-27 | Disposition: A | Discharge status: Home / Self Care | Payer: 59 | Attending: Emergency Medicine | Admitting: Emergency Medicine

## 2021-08-27 ENCOUNTER — Other Ambulatory Visit: Payer: Self-pay

## 2021-08-27 ENCOUNTER — Encounter: Payer: Self-pay | Admitting: Emergency Medicine

## 2021-08-27 DIAGNOSIS — Z87891 Personal history of nicotine dependence: Secondary | ICD-10-CM | POA: Diagnosis not present

## 2021-08-27 DIAGNOSIS — F419 Anxiety disorder, unspecified: Secondary | ICD-10-CM

## 2021-08-27 DIAGNOSIS — Z7984 Long term (current) use of oral hypoglycemic drugs: Secondary | ICD-10-CM | POA: Diagnosis not present

## 2021-08-27 DIAGNOSIS — E119 Type 2 diabetes mellitus without complications: Secondary | ICD-10-CM | POA: Insufficient documentation

## 2021-08-27 MED ORDER — LORAZEPAM 0.5 MG PO TABS: 0.2500 mg | ORAL_TABLET | Freq: Two times a day (BID) | ORAL | 0 refills | Status: DC

## 2021-08-27 NOTE — Discharge Instructions (Signed)
Prescription given for Ativan. Take medication as directed and do not operate machinery, drive a car, or work while taking this medication as it can make you drowsy.   Please follow up with your primary care provider within 5-7 days for re-evaluation of your symptoms. If you do not have a primary care provider, information for a healthcare clinic has been provided for you to make arrangements for follow up care. Please return to the emergency department for any new or worsening symptoms.

## 2021-08-27 NOTE — ED Triage Notes (Signed)
Patient here POV from Home with Anxiety.  Patient states this Weekend he had a Panic Attack at a [employer]. Anxiety continued throughout these past few days.   Patient was seen at [facility] and was given Atarax but states it has not helped. Patient has no Physical Symptoms.   NAD Noted during Triage. A&Ox4. GCS 15. Ambulatory.

## 2021-08-27 NOTE — ED Provider Notes (Signed)
°[facility] EM[facility] DEPT °Provider Note ° ° °CSN: [id number] °Arrival date & time: 08/27/21  1058 ° °  ° °History °Chief Complaint  °Patient presents with  ° Anxiety  ° ° °Nicki Biven is a 45 y.o. male. ° °HPI ° °45-year-old male with a history of dyslipidemia presents to the emergency department today for evaluation of anxiety.  Patient states that he had a panic attack a few days ago when he was visiting family for Christmas.  At that time he states he started having palpitations and was pacing around the room.  He states he later felt weak.  He took a COVID test and that was negative and also checked his blood sugar which was normal at that time.  Since then he reports persistent anxiety and feeling increased stress.  He notes that he has experienced trauma in the past, the holidays always cause increased stress and he also recently accepted a new position at his job where he has a lot more responsibility and works in a department that is understaffed. ° °He was seen at behavioral health and prescribed hydroxyzine however this did not really improve his symptoms and he did not like the way it made him feel.  He reports that he is currently in the process of trying to set up an appointment with a behavioral health specialist however this appointment is not until the 27th so he is hoping to have some relief before this appointment. ° °He does not endorse SI or HI ° °History reviewed. No pertinent past medical history. ° °Patient Active Problem List  ° Diagnosis Date Noted  ° Dyslipidemia associated with type 2 diabetes mellitus ([facility]) 06/01/2019  ° Dyslipidemia 06/01/2019  ° ° °History reviewed. No pertinent surgical history.  ° ° ° °No family history on file. ° °Social History  ° °Tobacco Use  ° Smoking status: Former  ° Smokeless tobacco: Never  °Vaping Use  ° Vaping Use: Never used  °Substance Use Topics  ° Alcohol use: Yes  ° Drug use: Never  ° ° °Home Medications °Prior to Admission medications    °Medication Sig Start Date End Date Taking? Authorizing Provider  °blood glucose meter kit and supplies KIT Dispense based on patient and insurance preference. Use up to four times daily as directed. (FOR ICD-9 250.00, 250.01). 10/11/20   Sagardia, Miguel Jose, MD  °cyclobenzaprine (FLEXERIL) 10 MG tablet Take 1 tablet (10 mg total) by mouth 2 (two) times daily as needed for muscle spasms. °Patient not taking: Reported on 10/11/2020 07/09/20   Matthews, Stephanie, NP  °diclofenac (VOLTAREN) 75 MG EC tablet Take 1 tablet (75 mg total) by mouth 2 (two) times daily. °Patient not taking: Reported on 10/11/2020 07/12/20   Morrow, Richard, NP  °diphenhydrAMINE (BENADRYL) 25 mg capsule Take 50 mg by mouth every 6 (six) hours as needed.    [provider]  °EPINEPHrine (AUVI-Q) 0.3 mg/0.3 mL IJ SOAJ injection Inject 0.3 mLs (0.3 mg total) into the muscle as needed for anaphylaxis. 04/26/19   Kim, Yoon M, DO  °glipiZIDE (GLUCOTROL) 5 MG tablet Take 1 tablet (5 mg total) by mouth 2 (two) times daily before a meal. 06/01/19 08/30/19  Sagardia, Miguel Jose, MD  °glucose blood test strip Use as instructed 10/11/20   Sagardia, Miguel Jose, MD  °hydrOXYzine (ATARAX) 25 MG tablet Take 1 tablet (25 mg total) by mouth 3 (three) times daily as needed. 08/26/21   Lewis, Tanika N, NP  °ibuprofen (ADVIL) 800 MG tablet Take 1   tablet (800 mg total) by mouth every 8 (eight) hours as needed for moderate pain. 07/09/20   Matthews, Stephanie, NP  °levocetirizine (XYZAL) 5 MG tablet Take 1 tablet (5 mg total) by mouth every evening. °Patient not taking: Reported on 10/11/2020 04/23/19   Kim, Yoon M, DO  °meloxicam (MOBIC) 15 MG tablet Take 1 tablet (15 mg total) by mouth daily. °Patient not taking: Reported on 10/11/2020 07/19/20   Hudnall, Shane R, MD  °metFORMIN (GLUCOPHAGE) 500 MG tablet Take 1 tablet (500 mg total) by mouth 2 (two) times daily with a meal. 09/22/19   Sagardia, Miguel Jose, MD  °methocarbamol (ROBAXIN) 500 MG tablet Take 1 tablet  (500 mg total) by mouth 4 (four) times daily. 07/12/20   Morrow, Richard, NP  °rosuvastatin (CRESTOR) 20 MG tablet Take 1 tablet (20 mg total) by mouth daily. 10/11/20   Sagardia, Miguel Jose, MD  ° ° °Allergies    °Shellfish allergy ° °Review of Systems   °Review of Systems  °Constitutional:  Negative for fever.  °HENT:  Negative for ear pain and sore throat.   °Eyes:  Negative for visual disturbance.  °Respiratory:  Negative for shortness of breath.   °Cardiovascular:  Positive for palpitations (resolved). Negative for chest pain.  °Gastrointestinal:  Negative for abdominal pain.  °Genitourinary:  Negative for dysuria and hematuria.  °Musculoskeletal:  Negative for back pain.  °Skin:  Negative for rash.  °Neurological:  Negative for headaches.  °All other systems reviewed and are negative. ° °Physical Exam °Updated Vital Signs °BP (!) 147/85 (BP Location: Right Arm)    Pulse (!) 113    Temp 98.8 °F (37.1 °C)    Resp 18    Ht 5' 11" (1.803 m)    Wt 97.5 kg    SpO2 96%    BMI 29.98 kg/m²  ° °Physical Exam °Vitals and nursing note reviewed.  °Constitutional:   °   General: He is not in acute distress. °   Appearance: He is well-developed.  °HENT:  °   Head: Normocephalic and atraumatic.  °Eyes:  °   Conjunctiva/sclera: Conjunctivae normal.  °Cardiovascular:  °   Rate and Rhythm: Normal rate.  °Pulmonary:  °   Effort: Pulmonary effort is normal.  °Abdominal:  °   General: Abdomen is flat.  °Musculoskeletal:     °   General: No swelling.  °   Cervical back: Neck supple.  °Neurological:  °   Mental Status: He is alert.  °Psychiatric:     °   Mood and Affect: Mood normal.  ° ° °ED Results / Procedures / Treatments   °Labs °(all labs ordered are listed, but only abnormal results are displayed) °Labs Reviewed - No data to display ° °EKG °None ° °Radiology °No results found. ° °Procedures °Procedures  ° °Medications Ordered in ED °Medications - No data to display ° °ED Course  °I have reviewed the triage vital signs and the  nursing notes. ° °Pertinent labs & imaging results that were available during my care of the patient were reviewed by me and considered in my medical decision making (see chart for details). ° °  °MDM Rules/Calculators/A&P °                        ° °45-year-old male presents the emergency department today for evaluation of anxiety which is developed over the last few days.  He experienced a panic attack a few days ago.  He   He was seen at behavioral health urgent care and was prescribed hydroxyzine.  He is planning to make an appointment with a counselor however he cannot be seen until next week.  He is hoping for some relief of his symptoms.  He does not currently endorse any SI, HI.  He had a long discussion about lifestyle modifications to help improve his symptoms.  We discussed decreasing the dose of hydroxyzine to help combat drowsiness.  I have recommended if he does not have improvement of his symptoms and I have given him a short course of Ativan at a low-dose to help with his symptoms.  We did discuss the dangers of this medication being addictive and he is fully aware of this.  Advised on follow-up and return precautions.  He voiced understanding plan reasons to return.  All questions answered.  Patient able for discharge.   Final Clinical Impression(s) / ED Diagnoses Final diagnoses:  Anxiety    Rx / DC Orders ED Discharge Orders     None        Bishop Dublin 08/27/21 1338    Dorie Rank, MD 08/28/21 1454

## 2021-08-29 NOTE — Telephone Encounter (Signed)
Called and scheduled pt appointment

## 2021-09-05 ENCOUNTER — Telehealth (HOSPITAL_COMMUNITY): Payer: Self-pay | Admitting: Emergency Medicine

## 2021-09-05 NOTE — BH Assessment (Signed)
Care Management - Follow Up [facility] Discharges   Writer made contact with the patient. Patient reports that he has a follow up appointment with his Primary Care Physician regarding the management of psychiatric medication

## 2021-09-13 ENCOUNTER — Ambulatory Visit: Payer: 59 | Admitting: Emergency Medicine

## 2021-09-13 ENCOUNTER — Other Ambulatory Visit: Payer: Self-pay

## 2021-09-13 ENCOUNTER — Encounter: Payer: Self-pay | Admitting: Emergency Medicine

## 2021-09-13 VITALS — BP 118/60 | HR 86 | Ht 71.0 in | Wt 200.0 lb

## 2021-09-13 DIAGNOSIS — F411 Generalized anxiety disorder: Secondary | ICD-10-CM

## 2021-09-13 DIAGNOSIS — E1169 Type 2 diabetes mellitus with other specified complication: Secondary | ICD-10-CM

## 2021-09-13 DIAGNOSIS — E785 Hyperlipidemia, unspecified: Secondary | ICD-10-CM

## 2021-09-13 HISTORY — DX: Generalized anxiety disorder: F41.1

## 2021-09-13 LAB — CBC WITH DIFFERENTIAL/PLATELET
Basophils Absolute: 0 10*3/uL (ref 0.0–0.1)
Basophils Relative: 0.4 % (ref 0.0–3.0)
Eosinophils Absolute: 0 10*3/uL (ref 0.0–0.7)
Eosinophils Relative: 0.2 % (ref 0.0–5.0)
HCT: 43.5 % (ref 39.0–52.0)
Hemoglobin: 14.5 g/dL (ref 13.0–17.0)
Lymphocytes Relative: 23.7 % (ref 12.0–46.0)
Lymphs Abs: 1.4 10*3/uL (ref 0.7–4.0)
MCHC: 33.5 g/dL (ref 30.0–36.0)
MCV: 95.4 fl (ref 78.0–100.0)
Monocytes Absolute: 0.5 10*3/uL (ref 0.1–1.0)
Monocytes Relative: 8.1 % (ref 3.0–12.0)
Neutro Abs: 4.1 10*3/uL (ref 1.4–7.7)
Neutrophils Relative %: 67.6 % (ref 43.0–77.0)
Platelets: 285 10*3/uL (ref 150.0–400.0)
RBC: 4.55 Mil/uL (ref 4.22–5.81)
RDW: 13.4 % (ref 11.5–15.5)
WBC: 6.1 10*3/uL (ref 4.0–10.5)

## 2021-09-13 LAB — COMPREHENSIVE METABOLIC PANEL
ALT: 13 U/L (ref 0–53)
AST: 16 U/L (ref 0–37)
Albumin: 4.2 g/dL (ref 3.5–5.2)
Alkaline Phosphatase: 96 U/L (ref 39–117)
BUN: 13 mg/dL (ref 6–23)
CO2: 30 mEq/L (ref 19–32)
Calcium: 9.4 mg/dL (ref 8.4–10.5)
Chloride: 104 mEq/L (ref 96–112)
Creatinine, Ser: 1.19 mg/dL (ref 0.40–1.50)
GFR: 74.5 mL/min (ref >60.00)
Glucose, Bld: 97 mg/dL (ref 70–99)
Potassium: 4.4 mEq/L (ref 3.5–5.1)
Sodium: 139 mEq/L (ref 135–145)
Total Bilirubin: 0.7 mg/dL (ref 0.2–1.2)
Total Protein: 7.4 g/dL (ref 6.0–8.3)

## 2021-09-13 LAB — TSH: TSH: 0.65 u[IU]/mL (ref 0.35–5.50)

## 2021-09-13 LAB — HEMOGLOBIN A1C: Hgb A1c MFr Bld: 6.1 % (ref 4.6–6.5)

## 2021-09-13 LAB — LIPID PANEL
Cholesterol: 140 mg/dL (ref 0–200)
HDL: 38.2 mg/dL — ABNORMAL LOW (ref >39.00)
LDL Cholesterol: 78 mg/dL (ref 0–99)
NonHDL: 101.86
Total CHOL/HDL Ratio: 4
Triglycerides: 118 mg/dL (ref 0.0–149.0)
VLDL: 23.6 mg/dL (ref 0.0–40.0)

## 2021-09-13 MED ORDER — ESCITALOPRAM OXALATE 10 MG PO TABS: 10.0000 mg | ORAL_TABLET | Freq: Every day | ORAL | 1 refills | Status: DC

## 2021-09-13 NOTE — Assessment & Plan Note (Signed)
Stress management advice given.  Generalized anxiety disorder/panic attacks diagnosis discussed.  May benefit from starting Lexapro 10 mg daily.  Will titrate up if tolerable.  Follow-up in 4 weeks.

## 2021-09-13 NOTE — Assessment & Plan Note (Addendum)
On no treatment at present time. We will start treatment based on numbers today. Blood work done today. Diet and nutrition discussed.

## 2021-09-13 NOTE — Progress Notes (Signed)
Jason Decker 46 y.o.   Chief Complaint  Patient presents with   Panic Attack    Pt states he has other concerns for provider, would not say.    Diabetes    HISTORY OF PRESENT ILLNESS: This is a 46 y.o. male with history of diabetes and dyslipidemia here for follow-up. Also complaining of recent panic attacks and general anxiety. Presently on no medications for diabetes or high cholesterol. Last office visit with me nearly a year ago. No other complaints or medical concerns today.  Diabetes Hypoglycemia symptoms include nervousness/anxiousness. Pertinent negatives for hypoglycemia include no dizziness or headaches. Pertinent negatives for diabetes include no chest pain.    Prior to Admission medications   Medication Sig Start Date End Date Taking? Authorizing Provider  blood glucose meter kit and supplies KIT Dispense based on patient and insurance preference. Use up to four times daily as directed. (FOR ICD-9 250.00, 250.01). 10/11/20  Yes Dajon Lazar, Ines Bloomer, MD  diphenhydrAMINE (BENADRYL) 25 mg capsule Take 50 mg by mouth every 6 (six) hours as needed.   Yes [provider]  EPINEPHrine (AUVI-Q) 0.3 mg/0.3 mL IJ SOAJ injection Inject 0.3 mLs (0.3 mg total) into the muscle as needed for anaphylaxis. 04/26/19  Yes Garnet Sierras, DO  escitalopram (LEXAPRO) 10 MG tablet Take 1 tablet (10 mg total) by mouth daily. 09/13/21 12/12/21 Yes Jeiden Daughtridge, Ines Bloomer, MD  glipiZIDE (GLUCOTROL) 5 MG tablet Take 1 tablet (5 mg total) by mouth 2 (two) times daily before a meal. 06/01/19 09/13/21 Yes Maebel Marasco, Ines Bloomer, MD  glucose blood test strip Use as instructed 10/11/20  Yes Haylee Mcanany, Ines Bloomer, MD  hydrOXYzine (ATARAX) 25 MG tablet Take 1 tablet (25 mg total) by mouth 3 (three) times daily as needed. 08/26/21  Yes Derrill Center, NP  levocetirizine (XYZAL) 5 MG tablet Take 1 tablet (5 mg total) by mouth every evening. 04/23/19  Yes Garnet Sierras, DO  LORazepam (ATIVAN) 0.5 MG tablet Take 0.5  tablets (0.25 mg total) by mouth 2 (two) times daily. 08/27/21  Yes Couture, Cortni S, PA-C  metFORMIN (GLUCOPHAGE) 500 MG tablet Take 1 tablet (500 mg total) by mouth 2 (two) times daily with a meal. 09/22/19  Yes Colden Samaras, Ines Bloomer, MD  rosuvastatin (CRESTOR) 20 MG tablet Take 1 tablet (20 mg total) by mouth daily. 10/11/20  Yes Neoma Uhrich, Ines Bloomer, MD  cyclobenzaprine (FLEXERIL) 10 MG tablet Take 1 tablet (10 mg total) by mouth 2 (two) times daily as needed for muscle spasms. Patient not taking: Reported on 10/11/2020 07/09/20   Faustino Congress, NP  methocarbamol (ROBAXIN) 500 MG tablet Take 1 tablet (500 mg total) by mouth 4 (four) times daily. Patient not taking: Reported on 09/13/2021 07/12/20   Maximiano Coss, NP    Allergies  Allergen Reactions   Shellfish Allergy Anaphylaxis    Patient Active Problem List   Diagnosis Date Noted   Dyslipidemia associated with type 2 diabetes mellitus ([place]) 06/01/2019   Dyslipidemia 06/01/2019    No past medical history on file.  No past surgical history on file.  Social History   Socioeconomic History   Marital status: Single    Spouse name: Not on file   Number of children: Not on file   Years of education: Not on file   Highest education level: Not on file  Occupational History   Not on file  Tobacco Use   Smoking status: Former   Smokeless tobacco: Never  Vaping Use   Vaping Use:  Never used  Substance and Sexual Activity   Alcohol use: Yes   Drug use: Never   Sexual activity: Not on file  Other Topics Concern   Not on file  Social History Narrative   ** Merged History Encounter **       Social Determinants of Health   Financial Resource Strain: Not on file  Food Insecurity: Not on file  Transportation Needs: Not on file  Physical Activity: Not on file  Stress: Not on file  Social Connections: Not on file  Intimate Partner Violence: Not on file    No family history on file.   Review of Systems   Constitutional: Negative.  Negative for chills and fever.  HENT: Negative.  Negative for congestion and sore throat.   Respiratory: Negative.  Negative for cough and shortness of breath.   Cardiovascular: Negative.  Negative for chest pain and palpitations.  Gastrointestinal: Negative.  Negative for abdominal pain, diarrhea, nausea and vomiting.  Genitourinary: Negative.  Negative for dysuria and hematuria.  Musculoskeletal: Negative.   Skin: Negative.  Negative for rash.  Neurological: Negative.  Negative for dizziness and headaches.  Psychiatric/Behavioral:  Positive for depression. The patient is nervous/anxious.   All other systems reviewed and are negative.  Today's Vitals   09/13/21 1037  BP: 118/60  Pulse: 86  SpO2: 98%  Weight: 200 lb (90.7 kg)  Height: 5' 11"  (1.803 m)   Body mass index is 27.89 kg/m.  Physical Exam Vitals reviewed.  Constitutional:      Appearance: Normal appearance.  HENT:     Head: Normocephalic.     Mouth/Throat:     Mouth: Mucous membranes are moist.     Pharynx: Oropharynx is clear.  Eyes:     Extraocular Movements: Extraocular movements intact.     Conjunctiva/sclera: Conjunctivae normal.     Pupils: Pupils are equal, round, and reactive to light.  Cardiovascular:     Rate and Rhythm: Normal rate and regular rhythm.     Pulses: Normal pulses.     Heart sounds: Normal heart sounds.  Pulmonary:     Effort: Pulmonary effort is normal.     Breath sounds: Normal breath sounds.  Abdominal:     General: Bowel sounds are normal. There is no distension.     Palpations: Abdomen is soft.     Tenderness: There is no abdominal tenderness.  Musculoskeletal:     Cervical back: Normal range of motion and neck supple.  Skin:    General: Skin is warm and dry.     Capillary Refill: Capillary refill takes less than 2 seconds.  Neurological:     General: No focal deficit present.     Mental Status: He is alert and oriented to person, place, and  time.  Psychiatric:        Mood and Affect: Mood normal.        Behavior: Behavior normal.     ASSESSMENT & PLAN: A total of 48 minutes was spent with the patient and counseling/coordination of care regarding preparing for this visit, review of most recent office visit note, review of most recent blood work results, need for blood work today, cardiovascular risks associated with uncontrolled diabetes and high cholesterol, diagnosis and treatment of generalized anxiety disorder/panic attacks, stress management advice, education on nutrition and importance of physical exercise, prognosis, documentation and need for follow-up in 4 weeks.  Problem List Items Addressed This Visit       Endocrine   Dyslipidemia associated  with type 2 diabetes mellitus ([place])    On no treatment at present time. We will start treatment based on numbers today. Blood work done today. Diet and nutrition discussed.      Relevant Orders   Comprehensive metabolic panel   CBC with Differential/Platelet   TSH   Hemoglobin A1c   Lipid panel     Other   Generalized anxiety disorder - Primary    Stress management advice given.  Generalized anxiety disorder/panic attacks diagnosis discussed.  May benefit from starting Lexapro 10 mg daily.  Will titrate up if tolerable.  Follow-up in 4 weeks.      Relevant Medications   escitalopram (LEXAPRO) 10 MG tablet     Patient Instructions  Generalized Anxiety Disorder, Adult Generalized anxiety disorder (GAD) is a mental health condition. Unlike normal worries, anxiety related to GAD is not triggered by a specific event. These worries do not fade or get better with time. GAD interferes with relationships, work, and school. GAD symptoms can vary from mild to severe. People with severe GAD can have intense waves of anxiety with physical symptoms that are similar to panic attacks. What are the causes? The exact cause of GAD is not known, but the following are believed to  have an impact: Differences in natural brain chemicals. Genes passed down from parents to children. Differences in the way threats are perceived. Development and stress during childhood. Personality. What increases the risk? The following factors may make you more likely to develop this condition: Being male. Having a family history of anxiety disorders. Being very shy. Experiencing very stressful life events, such as the death of a loved one. Having a very stressful family environment. What are the signs or symptoms? People with GAD often worry excessively about many things in their lives, such as their health and family. Symptoms may also include: Mental and emotional symptoms: Worrying excessively about natural disasters. Fear of being late. Difficulty concentrating. Fears that others are judging your performance. Physical symptoms: Fatigue. Headaches, muscle tension, muscle twitches, trembling, or feeling shaky. Feeling like your heart is pounding or beating very fast. Feeling out of breath or like you cannot take a deep breath. Having trouble falling asleep or staying asleep, or experiencing restlessness. Sweating. Nausea, diarrhea, or irritable bowel syndrome (IBS). Behavioral symptoms: Experiencing erratic moods or irritability. Avoidance of new situations. Avoidance of people. Extreme difficulty making decisions. How is this diagnosed? This condition is diagnosed based on your symptoms and medical history. You will also have a physical exam. Your health care provider may perform tests to rule out other possible causes of your symptoms. To be diagnosed with GAD, a person must have anxiety that: Is out of his or her control. Affects several different aspects of his or her life, such as work and relationships. Causes distress that makes him or her unable to take part in normal activities. Includes at least three symptoms of GAD, such as restlessness, fatigue, trouble  concentrating, irritability, muscle tension, or sleep problems. Before your health care provider can confirm a diagnosis of GAD, these symptoms must be present more days than they are not, and they must last for 6 months or longer. How is this treated? This condition may be treated with: Medicine. Antidepressant medicine is usually prescribed for long-term daily control. Anti-anxiety medicines may be added in severe cases, especially when panic attacks occur. Talk therapy (psychotherapy). Certain types of talk therapy can be helpful in treating GAD by providing support, education, and guidance. Options  include: Cognitive behavioral therapy (CBT). People learn coping skills and self-calming techniques to ease their physical symptoms. They learn to identify unrealistic thoughts and behaviors and to replace them with more appropriate thoughts and behaviors. Acceptance and commitment therapy (ACT). This treatment teaches people how to be mindful as a way to cope with unwanted thoughts and feelings. Biofeedback. This process trains you to manage your body's response (physiological response) through breathing techniques and relaxation methods. You will work with a therapist while machines are used to monitor your physical symptoms. Stress management techniques. These include yoga, meditation, and exercise. A mental health specialist can help determine which treatment is best for you. Some people see improvement with one type of therapy. However, other people require a combination of therapies. Follow these instructions at home: Lifestyle Maintain a consistent routine and schedule. Anticipate stressful situations. Create a plan and allow extra time to work with your plan. Practice stress management or self-calming techniques that you have learned from your therapist or your health care provider. Exercise regularly and spend time outdoors. Eat a healthy diet that includes plenty of vegetables, fruits, whole  grains, low-fat dairy products, and lean protein. Do not eat a lot of foods that are high in fat, added sugar, or salt (sodium). Drink plenty of water. Avoid alcohol. Alcohol can increase anxiety. Avoid caffeine and certain over-the-counter cold medicines. These may make you feel worse. Ask your pharmacist which medicines to avoid. General instructions Take over-the-counter and prescription medicines only as told by your health care provider. Understand that you are likely to have setbacks. Accept this and be kind to yourself as you persist to take better care of yourself. Anticipate stressful situations. Create a plan and allow extra time to work with your plan. Recognize and accept your accomplishments, even if you judge them as small. Spend time with people who care about you. Keep all follow-up visits. This is important. Where to find more information [place]: https://carter.com/ Substance Abuse and Mental Health Services: ktimeonline.com Contact a health care provider if: Your symptoms do not get better. Your symptoms get worse. You have signs of depression, such as: A persistently sad or irritable mood. Loss of enjoyment in activities that used to bring you joy. Change in weight or eating. Changes in sleeping habits. Get help right away if: You have thoughts about hurting yourself or others. If you ever feel like you may hurt yourself or others, or have thoughts about taking your own life, get help right away. Go to your nearest emergency department or: Call your local emergency services (911 in the [place] Call a suicide crisis helpline, such as the [facility] at 818-108-2474 or 988 in the [place]. This is open 24 hours a day in the [place] Text the Crisis Text Line at 320-104-7890 (in the [place].). Summary Generalized anxiety disorder (GAD) is a mental health condition that involves worry that is not triggered by a specific event. People with  GAD often worry excessively about many things in their lives, such as their health and family. GAD may cause symptoms such as restlessness, trouble concentrating, sleep problems, frequent sweating, nausea, diarrhea, headaches, and trembling or muscle twitching. A mental health specialist can help determine which treatment is best for you. Some people see improvement with one type of therapy. However, other people require a combination of therapies. This information is not intended to replace advice given to you by your health care provider. Make sure you discuss any questions you  have with your health care provider. Document Revised: 03/21/2021 Document Reviewed: 12/17/2020 Elsevier Patient Education  2022 [place], MD [facility]

## 2021-09-13 NOTE — Patient Instructions (Signed)

## 2021-10-17 ENCOUNTER — Other Ambulatory Visit: Payer: Self-pay

## 2021-10-17 ENCOUNTER — Encounter: Payer: Self-pay | Admitting: Emergency Medicine

## 2021-10-17 ENCOUNTER — Ambulatory Visit: Payer: 59 | Admitting: Emergency Medicine

## 2021-10-17 VITALS — BP 136/76 | HR 97 | Temp 98.4°F | Ht 71.0 in | Wt 192.5 lb

## 2021-10-17 DIAGNOSIS — G4700 Insomnia, unspecified: Secondary | ICD-10-CM

## 2021-10-17 DIAGNOSIS — F411 Generalized anxiety disorder: Secondary | ICD-10-CM

## 2021-10-17 MED ORDER — TRAZODONE HCL 50 MG PO TABS: 50.0000 mg | ORAL_TABLET | Freq: Every day | ORAL | 3 refills | Status: DC

## 2021-10-17 NOTE — Patient Instructions (Signed)

## 2021-10-17 NOTE — Assessment & Plan Note (Signed)
Affecting quality of life.  We will start trazodone 50 mg at bedtime. Sleep hygiene instructions given.

## 2021-10-17 NOTE — Assessment & Plan Note (Signed)
Stable off medication.  Did not tolerate Lexapro.

## 2021-10-17 NOTE — Progress Notes (Signed)
Jason Decker 46 y.o.   Chief Complaint  Patient presents with   Follow-up   trouble sleeping     HISTORY OF PRESENT ILLNESS: This is a 46 y.o. male here for for 4-week follow-up of generalized anxiety disorder. Started on Lexapro 10 mg daily.  However developed side effect which he did not like and stopped after 2 doses. Normal recent panic attacks however has not been sleeping. Sleeps average of 2 to 3 hours per night.  Affecting quality of life. Other complaints or medical concerns today.  HPI   Prior to Admission medications   Medication Sig Start Date End Date Taking? Authorizing Provider  blood glucose meter kit and supplies KIT Dispense based on patient and insurance preference. Use up to four times daily as directed. (FOR ICD-9 250.00, 250.01). 10/11/20   Horald Pollen, MD  cyclobenzaprine (FLEXERIL) 10 MG tablet Take 1 tablet (10 mg total) by mouth 2 (two) times daily as needed for muscle spasms. Patient not taking: Reported on 10/11/2020 07/09/20   Faustino Congress, NP  diphenhydrAMINE (BENADRYL) 25 mg capsule Take 50 mg by mouth every 6 (six) hours as needed.    [provider]  EPINEPHrine (AUVI-Q) 0.3 mg/0.3 mL IJ SOAJ injection Inject 0.3 mLs (0.3 mg total) into the muscle as needed for anaphylaxis. 04/26/19   Garnet Sierras, DO  escitalopram (LEXAPRO) 10 MG tablet Take 1 tablet (10 mg total) by mouth daily. 09/13/21 12/12/21  Horald Pollen, MD  glipiZIDE (GLUCOTROL) 5 MG tablet Take 1 tablet (5 mg total) by mouth 2 (two) times daily before a meal. 06/01/19 09/13/21  Horald Pollen, MD  glucose blood test strip Use as instructed 10/11/20   Horald Pollen, MD  hydrOXYzine (ATARAX) 25 MG tablet Take 1 tablet (25 mg total) by mouth 3 (three) times daily as needed. 08/26/21   Derrill Center, NP  levocetirizine (XYZAL) 5 MG tablet Take 1 tablet (5 mg total) by mouth every evening. 04/23/19   Garnet Sierras, DO  LORazepam (ATIVAN) 0.5 MG tablet Take 0.5  tablets (0.25 mg total) by mouth 2 (two) times daily. 08/27/21   Couture, Cortni S, PA-C  metFORMIN (GLUCOPHAGE) 500 MG tablet Take 1 tablet (500 mg total) by mouth 2 (two) times daily with a meal. 09/22/19   Sangeeta Youse, Ines Bloomer, MD  methocarbamol (ROBAXIN) 500 MG tablet Take 1 tablet (500 mg total) by mouth 4 (four) times daily. Patient not taking: Reported on 09/13/2021 07/12/20   Maximiano Coss, NP  rosuvastatin (CRESTOR) 20 MG tablet Take 1 tablet (20 mg total) by mouth daily. 10/11/20   Horald Pollen, MD    Allergies  Allergen Reactions   Shellfish Allergy Anaphylaxis    Patient Active Problem List   Diagnosis Date Noted   Generalized anxiety disorder 09/13/2021   Dyslipidemia associated with type 2 diabetes mellitus ([place]) 06/01/2019   Dyslipidemia 06/01/2019    No past medical history on file.  No past surgical history on file.  Social History   Socioeconomic History   Marital status: Single    Spouse name: Not on file   Number of children: Not on file   Years of education: Not on file   Highest education level: Not on file  Occupational History   Not on file  Tobacco Use   Smoking status: Former   Smokeless tobacco: Never  Vaping Use   Vaping Use: Never used  Substance and Sexual Activity   Alcohol use: Yes  Drug use: Never   Sexual activity: Not on file  Other Topics Concern   Not on file  Social History Narrative   ** Merged History Encounter **       Social Determinants of Health   Financial Resource Strain: Not on file  Food Insecurity: Not on file  Transportation Needs: Not on file  Physical Activity: Not on file  Stress: Not on file  Social Connections: Not on file  Intimate Partner Violence: Not on file    No family history on file.   Review of Systems  Constitutional: Negative.  Negative for chills and fever.  HENT: Negative.  Negative for congestion and sore throat.   Eyes: Negative.   Respiratory: Negative.  Negative for cough  and shortness of breath.   Cardiovascular: Negative.  Negative for chest pain and palpitations.  Gastrointestinal:  Negative for abdominal pain, nausea and vomiting.  Genitourinary: Negative.  Negative for dysuria and hematuria.  Skin: Negative.  Negative for rash.  Neurological: Negative.  Negative for dizziness and headaches.  Psychiatric/Behavioral:  The patient has insomnia.   All other systems reviewed and are negative. Vitals:   10/17/21 1341  BP: 136/76  Pulse: 97  Temp: 98.4 F (36.9 C)  SpO2: 96%     Physical Exam Vitals reviewed.  Constitutional:      Appearance: Normal appearance.  HENT:     Head: Normocephalic.  Eyes:     Extraocular Movements: Extraocular movements intact.     Conjunctiva/sclera: Conjunctivae normal.     Pupils: Pupils are equal, round, and reactive to light.  Cardiovascular:     Rate and Rhythm: Normal rate and regular rhythm.     Pulses: Normal pulses.     Heart sounds: Normal heart sounds.  Pulmonary:     Effort: Pulmonary effort is normal.     Breath sounds: Normal breath sounds.  Musculoskeletal:        General: Normal range of motion.     Cervical back: No tenderness.  Lymphadenopathy:     Cervical: No cervical adenopathy.  Skin:    General: Skin is warm and dry.     Capillary Refill: Capillary refill takes less than 2 seconds.  Neurological:     General: No focal deficit present.     Mental Status: He is alert and oriented to person, place, and time.  Psychiatric:        Mood and Affect: Mood normal.        Behavior: Behavior normal.     ASSESSMENT & PLAN: Problem List Items Addressed This Visit       Other   Generalized anxiety disorder    Stable off medication.  Did not tolerate Lexapro.      Relevant Medications   traZODone (DESYREL) 50 MG tablet   Insomnia - Primary    Affecting quality of life.  We will start trazodone 50 mg at bedtime. Sleep hygiene instructions given.       Relevant Medications    traZODone (DESYREL) 50 MG tablet   Patient Instructions  Insomnia Insomnia is a sleep disorder that makes it difficult to fall asleep or stay asleep. Insomnia can cause fatigue, low energy, difficulty concentrating, mood swings, and poor performance at work or school. There are three different ways to classify insomnia: Difficulty falling asleep. Difficulty staying asleep. Waking up too early in the morning. Any type of insomnia can be long-term (chronic) or short-term (acute). Both are common. Short-term insomnia usually lasts for three months  or less. Chronic insomnia occurs at least three times a week for longer than three months. What are the causes? Insomnia may be caused by another condition, situation, or substance, such as: Anxiety. Certain medicines. Gastroesophageal reflux disease (GERD) or other gastrointestinal conditions. Asthma or other breathing conditions. Restless legs syndrome, sleep apnea, or other sleep disorders. Chronic pain. Menopause. Stroke. Abuse of alcohol, tobacco, or illegal drugs. Mental health conditions, such as depression. Caffeine. Neurological disorders, such as Alzheimer's disease. An overactive thyroid (hyperthyroidism). Sometimes, the cause of insomnia may not be known. What increases the risk? Risk factors for insomnia include: Gender. Women are affected more often than men. Age. Insomnia is more common as you get older. Stress. Lack of exercise. Irregular work schedule or working night shifts. Traveling between different time zones. Certain medical and mental health conditions. What are the signs or symptoms? If you have insomnia, the main symptom is having trouble falling asleep or having trouble staying asleep. This may lead to other symptoms, such as: Feeling fatigued or having low energy. Feeling nervous about going to sleep. Not feeling rested in the morning. Having trouble concentrating. Feeling irritable, anxious, or  depressed. How is this diagnosed? This condition may be diagnosed based on: Your symptoms and medical history. Your health care provider may ask about: Your sleep habits. Any medical conditions you have. Your mental health. A physical exam. How is this treated? Treatment for insomnia depends on the cause. Treatment may focus on treating an underlying condition that is causing insomnia. Treatment may also include: Medicines to help you sleep. Counseling or therapy. Lifestyle adjustments to help you sleep better. Follow these instructions at home: Eating and drinking  Limit or avoid alcohol, caffeinated beverages, and cigarettes, especially close to bedtime. These can disrupt your sleep. Do not eat a large meal or eat spicy foods right before bedtime. This can lead to digestive discomfort that can make it hard for you to sleep. Sleep habits  Keep a sleep diary to help you and your health care provider figure out what could be causing your insomnia. Write down: When you sleep. When you wake up during the night. How well you sleep. How rested you feel the next day. Any side effects of medicines you are taking. What you eat and drink. Make your bedroom a dark, comfortable place where it is easy to fall asleep. Put up shades or blackout curtains to block light from outside. Use a white noise machine to block noise. Keep the temperature cool. Limit screen use before bedtime. This includes: Watching TV. Using your smartphone, tablet, or computer. Stick to a routine that includes going to bed and waking up at the same times every day and night. This can help you fall asleep faster. Consider making a quiet activity, such as reading, part of your nighttime routine. Try to avoid taking naps during the day so that you sleep better at night. Get out of bed if you are still awake after 15 minutes of trying to sleep. Keep the lights down, but try reading or doing a quiet activity. When you feel  sleepy, go back to bed. General instructions Take over-the-counter and prescription medicines only as told by your health care provider. Exercise regularly, as told by your health care provider. Avoid exercise starting several hours before bedtime. Use relaxation techniques to manage stress. Ask your health care provider to suggest some techniques that may work well for you. These may include: Breathing exercises. Routines to release muscle tension. Visualizing peaceful  scenes. Make sure that you drive carefully. Avoid driving if you feel very sleepy. Keep all follow-up visits as told by your health care provider. This is important. Contact a health care provider if: You are tired throughout the day. You have trouble in your daily routine due to sleepiness. You continue to have sleep problems, or your sleep problems get worse. Get help right away if: You have serious thoughts about hurting yourself or someone else. If you ever feel like you may hurt yourself or others, or have thoughts about taking your own life, get help right away. You can go to your nearest emergency department or call: Your local emergency services (911 in the [place] A suicide crisis helpline, such as the [facility] at 646-162-5906 or 988 in the [place]. This is open 24 hours a day. Summary Insomnia is a sleep disorder that makes it difficult to fall asleep or stay asleep. Insomnia can be long-term (chronic) or short-term (acute). Treatment for insomnia depends on the cause. Treatment may focus on treating an underlying condition that is causing insomnia. Keep a sleep diary to help you and your health care provider figure out what could be causing your insomnia. This information is not intended to replace advice given to you by your health care provider. Make sure you discuss any questions you have with your health care provider. Document Revised: 03/21/2021 Document Reviewed: 07/06/2020 Elsevier  Patient Education  2022 [place], MD [facility]

## 2021-12-26 ENCOUNTER — Encounter: Payer: Self-pay | Admitting: Emergency Medicine

## 2021-12-26 ENCOUNTER — Ambulatory Visit (INDEPENDENT_AMBULATORY_CARE_PROVIDER_SITE_OTHER): Payer: 59 | Admitting: Emergency Medicine

## 2021-12-26 VITALS — BP 108/74 | HR 70 | Temp 98.2°F | Ht 71.0 in | Wt 192.2 lb

## 2021-12-26 DIAGNOSIS — Z114 Encounter for screening for human immunodeficiency virus [HIV]: Secondary | ICD-10-CM | POA: Diagnosis not present

## 2021-12-26 DIAGNOSIS — Z0000 Encounter for general adult medical examination without abnormal findings: Secondary | ICD-10-CM | POA: Diagnosis not present

## 2021-12-26 DIAGNOSIS — R002 Palpitations: Secondary | ICD-10-CM

## 2021-12-26 DIAGNOSIS — G4700 Insomnia, unspecified: Secondary | ICD-10-CM

## 2021-12-26 DIAGNOSIS — Z13228 Encounter for screening for other metabolic disorders: Secondary | ICD-10-CM

## 2021-12-26 DIAGNOSIS — Z13220 Encounter for screening for lipoid disorders: Secondary | ICD-10-CM

## 2021-12-26 DIAGNOSIS — E785 Hyperlipidemia, unspecified: Secondary | ICD-10-CM

## 2021-12-26 DIAGNOSIS — Z1159 Encounter for screening for other viral diseases: Secondary | ICD-10-CM | POA: Diagnosis not present

## 2021-12-26 DIAGNOSIS — Z130 Encounter for screening for diseases of the blood and blood-forming organs and certain disorders involving the immune mechanism: Secondary | ICD-10-CM

## 2021-12-26 DIAGNOSIS — E1169 Type 2 diabetes mellitus with other specified complication: Secondary | ICD-10-CM | POA: Diagnosis not present

## 2021-12-26 DIAGNOSIS — Z1329 Encounter for screening for other suspected endocrine disorder: Secondary | ICD-10-CM

## 2021-12-26 DIAGNOSIS — R6882 Decreased libido: Secondary | ICD-10-CM

## 2021-12-26 DIAGNOSIS — Z125 Encounter for screening for malignant neoplasm of prostate: Secondary | ICD-10-CM

## 2021-12-26 LAB — COMPREHENSIVE METABOLIC PANEL
ALT: 14 U/L (ref 0–53)
AST: 20 U/L (ref 0–37)
Albumin: 4.4 g/dL (ref 3.5–5.2)
Alkaline Phosphatase: 97 U/L (ref 39–117)
BUN: 10 mg/dL (ref 6–23)
CO2: 31 mEq/L (ref 19–32)
Calcium: 9.4 mg/dL (ref 8.4–10.5)
Chloride: 102 mEq/L (ref 96–112)
Creatinine, Ser: 1.02 mg/dL (ref 0.40–1.50)
GFR: 89.46 mL/min (ref >60.00)
Glucose, Bld: 85 mg/dL (ref 70–99)
Potassium: 4.4 mEq/L (ref 3.5–5.1)
Sodium: 138 mEq/L (ref 135–145)
Total Bilirubin: 1 mg/dL (ref 0.2–1.2)
Total Protein: 7.5 g/dL (ref 6.0–8.3)

## 2021-12-26 LAB — CBC WITH DIFFERENTIAL/PLATELET
Basophils Absolute: 0 10*3/uL (ref 0.0–0.1)
Basophils Relative: 0.1 % (ref 0.0–3.0)
Eosinophils Absolute: 0 10*3/uL (ref 0.0–0.7)
Eosinophils Relative: 0.8 % (ref 0.0–5.0)
HCT: 42.6 % (ref 39.0–52.0)
Hemoglobin: 14.3 g/dL (ref 13.0–17.0)
Lymphocytes Relative: 27.2 % (ref 12.0–46.0)
Lymphs Abs: 1.4 10*3/uL (ref 0.7–4.0)
MCHC: 33.5 g/dL (ref 30.0–36.0)
MCV: 95.3 fl (ref 78.0–100.0)
Monocytes Absolute: 0.5 10*3/uL (ref 0.1–1.0)
Monocytes Relative: 10.5 % (ref 3.0–12.0)
Neutro Abs: 3.2 10*3/uL (ref 1.4–7.7)
Neutrophils Relative %: 61.4 % (ref 43.0–77.0)
Platelets: 199 10*3/uL (ref 150.0–400.0)
RBC: 4.46 Mil/uL (ref 4.22–5.81)
RDW: 13.3 % (ref 11.5–15.5)
WBC: 5.2 10*3/uL (ref 4.0–10.5)

## 2021-12-26 LAB — POCT GLYCOSYLATED HEMOGLOBIN (HGB A1C): Hemoglobin A1C: 5.3 % (ref 4.0–5.6)

## 2021-12-26 LAB — LIPID PANEL
Cholesterol: 138 mg/dL (ref 0–200)
HDL: 47 mg/dL (ref >39.00)
LDL Cholesterol: 79 mg/dL (ref 0–99)
NonHDL: 90.71
Total CHOL/HDL Ratio: 3
Triglycerides: 60 mg/dL (ref 0.0–149.0)
VLDL: 12 mg/dL (ref 0.0–40.0)

## 2021-12-26 LAB — MICROALBUMIN / CREATININE URINE RATIO
Creatinine,U: 96.1 mg/dL
Microalb Creat Ratio: 0.7 mg/g (ref 0.0–30.0)
Microalb, Ur: <0.7 mg/dL (ref 0.0–1.9)

## 2021-12-26 MED ORDER — TRAZODONE HCL 50 MG PO TABS: 50.0000 mg | ORAL_TABLET | Freq: Every day | ORAL | 3 refills | Status: DC

## 2021-12-26 NOTE — Progress Notes (Signed)
Jason Decker ?46 y.o. ? ? ?Chief Complaint  ?Patient presents with  ? Annual Exam  ? ? ?HISTORY OF PRESENT ILLNESS: ?This is a 46 y.o. male here for annual exam. ?Non-smoker. ?Healthy male with a healthy lifestyle. ? ?HPI ? ? ?Prior to Admission medications   ?Medication Sig Start Date End Date Taking? Authorizing Provider  ?blood glucose meter kit and supplies KIT Dispense based on patient and insurance preference. Use up to four times daily as directed. (FOR ICD-9 250.00, 250.01). 10/11/20  Yes Ashleah Valtierra, Ines Bloomer, MD  ?glucose blood test strip Use as instructed 10/11/20  Yes Madiline Saffran, Ines Bloomer, MD  ?traZODone (DESYREL) 50 MG tablet Take 1 tablet (50 mg total) by mouth at bedtime. 10/17/21  Yes Marieanne Marxen, Ines Bloomer, MD  ?cyclobenzaprine (FLEXERIL) 10 MG tablet Take 1 tablet (10 mg total) by mouth 2 (two) times daily as needed for muscle spasms. ?Patient not taking: Reported on 10/11/2020 07/09/20   Faustino Congress, NP  ?diphenhydrAMINE (BENADRYL) 25 mg capsule Take 50 mg by mouth every 6 (six) hours as needed. ?Patient not taking: Reported on 12/26/2021    [provider]  ?EPINEPHrine (AUVI-Q) 0.3 mg/0.3 mL IJ SOAJ injection Inject 0.3 mLs (0.3 mg total) into the muscle as needed for anaphylaxis. ?Patient not taking: Reported on 10/17/2021 04/26/19   Garnet Sierras, DO  ?glipiZIDE (GLUCOTROL) 5 MG tablet Take 1 tablet (5 mg total) by mouth 2 (two) times daily before a meal. 06/01/19 09/13/21  Carliss Quast, Ines Bloomer, MD  ?hydrOXYzine (ATARAX) 25 MG tablet Take 1 tablet (25 mg total) by mouth 3 (three) times daily as needed. ?Patient not taking: Reported on 12/26/2021 08/26/21   Derrill Center, NP  ?levocetirizine (XYZAL) 5 MG tablet Take 1 tablet (5 mg total) by mouth every evening. ?Patient not taking: Reported on 12/26/2021 04/23/19   Garnet Sierras, DO  ?LORazepam (ATIVAN) 0.5 MG tablet Take 0.5 tablets (0.25 mg total) by mouth 2 (two) times daily. ?Patient not taking: Reported on 12/26/2021 08/27/21   Couture,  Cortni S, PA-C  ?metFORMIN (GLUCOPHAGE) 500 MG tablet Take 1 tablet (500 mg total) by mouth 2 (two) times daily with a meal. ?Patient not taking: Reported on 12/26/2021 09/22/19   Horald Pollen, MD  ?methocarbamol (ROBAXIN) 500 MG tablet Take 1 tablet (500 mg total) by mouth 4 (four) times daily. ?Patient not taking: Reported on 09/13/2021 07/12/20   Maximiano Coss, NP  ?rosuvastatin (CRESTOR) 20 MG tablet Take 1 tablet (20 mg total) by mouth daily. ?Patient not taking: Reported on 12/26/2021 10/11/20   Horald Pollen, MD  ? ? ?Allergies  ?Allergen Reactions  ? Shellfish Allergy Anaphylaxis  ? ? ?Patient Active Problem List  ? Diagnosis Date Noted  ? Insomnia 10/17/2021  ? Generalized anxiety disorder 09/13/2021  ? Dyslipidemia associated with type 2 diabetes mellitus ([place]) 06/01/2019  ? Dyslipidemia 06/01/2019  ? ? ?No past medical history on file. ? ?No past surgical history on file. ? ?Social History  ? ?Socioeconomic History  ? Marital status: Single  ?  Spouse name: Not on file  ? Number of children: Not on file  ? Years of education: Not on file  ? Highest education level: Not on file  ?Occupational History  ? Not on file  ?Tobacco Use  ? Smoking status: Former  ? Smokeless tobacco: Never  ?Vaping Use  ? Vaping Use: Never used  ?Substance and Sexual Activity  ? Alcohol use: Yes  ? Drug use: Never  ?  Sexual activity: Not on file  ?Other Topics Concern  ? Not on file  ?Social History Narrative  ? ** Merged History Encounter **  ?    ? ?Social Determinants of Health  ? ?Financial Resource Strain: Not on file  ?Food Insecurity: Not on file  ?Transportation Needs: Not on file  ?Physical Activity: Not on file  ?Stress: Not on file  ?Social Connections: Not on file  ?Intimate Partner Violence: Not on file  ? ? ?No family history on file. ? ? ?Review of Systems  ?Constitutional: Negative.  Negative for chills and fever.  ?HENT: Negative.  Negative for congestion and sore throat.   ?Respiratory: Negative.   Negative for cough and shortness of breath.   ?Cardiovascular:  Positive for palpitations.  ?Gastrointestinal:  Negative for abdominal pain, nausea and vomiting.  ?Genitourinary: Negative.   ?Skin: Negative.  Negative for rash.  ?Neurological:  Negative for dizziness and headaches.  ?All other systems reviewed and are negative. ?Today's Vitals  ? 12/26/21 1009  ?BP: 108/74  ?Pulse: 70  ?Temp: 98.2 ?F (36.8 ?C)  ?SpO2: 96%  ?Weight: 192 lb 4 oz (87.2 kg)  ?Height: 5' 11"  (1.803 m)  ? ?Body mass index is 26.81 kg/m?. ?Wt Readings from Last 3 Encounters:  ?12/26/21 192 lb 4 oz (87.2 kg)  ?10/17/21 192 lb 8 oz (87.3 kg)  ?09/13/21 200 lb (90.7 kg)  ? ? ? ?Physical Exam ?Vitals reviewed.  ?Constitutional:   ?   Appearance: Normal appearance.  ?HENT:  ?   Head: Normocephalic.  ?   Right Ear: Tympanic membrane, ear canal and external ear normal.  ?   Left Ear: Tympanic membrane, ear canal and external ear normal.  ?   Mouth/Throat:  ?   Mouth: Mucous membranes are moist.  ?   Pharynx: Oropharynx is clear.  ?Eyes:  ?   Extraocular Movements: Extraocular movements intact.  ?   Conjunctiva/sclera: Conjunctivae normal.  ?   Pupils: Pupils are equal, round, and reactive to light.  ?Cardiovascular:  ?   Rate and Rhythm: Normal rate and regular rhythm.  ?   Pulses: Normal pulses.  ?   Heart sounds: Normal heart sounds.  ?Pulmonary:  ?   Effort: Pulmonary effort is normal.  ?   Breath sounds: Normal breath sounds.  ?Abdominal:  ?   General: There is no distension.  ?   Palpations: Abdomen is soft.  ?   Tenderness: There is no abdominal tenderness.  ?Musculoskeletal:     ?   General: Normal range of motion.  ?   Cervical back: No tenderness.  ?   Right lower leg: No edema.  ?   Left lower leg: No edema.  ?Lymphadenopathy:  ?   Cervical: No cervical adenopathy.  ?Skin: ?   General: Skin is warm and dry.  ?   Capillary Refill: Capillary refill takes less than 2 seconds.  ?Neurological:  ?   General: No focal deficit present.  ?    Mental Status: He is alert and oriented to person, place, and time.  ?Psychiatric:     ?   Mood and Affect: Mood normal.     ?   Behavior: Behavior normal.  ? ?EKG: Sinus bradycardia with ventricular rate of 56/min.  No acute ischemic changes.  Normal EKG. ? ?ASSESSMENT & PLAN: ?Problem List Items Addressed This Visit   ? ?  ? Endocrine  ? Dyslipidemia associated with type 2 diabetes mellitus ([place])  ? Relevant Orders  ?  POCT HgB A1C  ? Comprehensive metabolic panel  ? Lipid panel  ? Urine Microalbumin w/creat. ratio  ? ?Other Visit Diagnoses   ? ? Routine general medical examination at a health care facility    -  Primary  ? Screening for HIV (human immunodeficiency virus)      ? Relevant Orders  ? HIV antibody  ? Need for hepatitis C screening test      ? Relevant Orders  ? Hepatitis C antibody screen  ? Screening for deficiency anemia      ? Relevant Orders  ? CBC with Differential  ? Screening for lipoid disorders      ? Screening for endocrine, metabolic and immunity disorder      ? Decreased sex drive      ? Relevant Orders  ? TestT+TestF+SHBG  ? ?  ? ?Modifiable risk factors discussed with patient. ?Anticipatory guidance according to age provided. ?The following topics were also discussed: ?Social Determinants of Health ?Smoking.  Non-smoker ?Diet and nutrition ?Benefits of exercise ?Cancer family history review ?Vaccinations recommendations ?Cardiovascular risk assessment ?Mental health including depression and anxiety ?Fall and accident prevention ? ?Patient Instructions  ?Health Maintenance, Male ?Adopting a healthy lifestyle and getting preventive care are important in promoting health and wellness. Ask your health care provider about: ?The right schedule for you to have regular tests and exams. ?Things you can do on your own to prevent diseases and keep yourself healthy. ?What should I know about diet, weight, and exercise? ?Eat a healthy diet ? ?Eat a diet that includes plenty of vegetables, fruits,  low-fat dairy products, and lean protein. ?Do not eat a lot of foods that are high in solid fats, added sugars, or sodium. ?Maintain a healthy weight ?Body mass index (BMI) is a measurement that can be used to [employer]

## 2021-12-26 NOTE — Patient Instructions (Signed)
Health Maintenance, Male Adopting a healthy lifestyle and getting preventive care are important in promoting health and wellness. Ask your health care provider about: The right schedule for you to have regular tests and exams. Things you can do on your own to prevent diseases and keep yourself healthy. What should I know about diet, weight, and exercise? Eat a healthy diet  Eat a diet that includes plenty of vegetables, fruits, low-fat dairy products, and lean protein. Do not eat a lot of foods that are high in solid fats, added sugars, or sodium. Maintain a healthy weight Body mass index (BMI) is a measurement that can be used to identify possible weight problems. It estimates body fat based on height and weight. Your health care provider can help determine your BMI and help you achieve or maintain a healthy weight. Get regular exercise Get regular exercise. This is one of the most important things you can do for your health. Most adults should: Exercise for at least 150 minutes each week. The exercise should increase your heart rate and make you sweat (moderate-intensity exercise). Do strengthening exercises at least twice a week. This is in addition to the moderate-intensity exercise. Spend less time sitting. Even light physical activity can be beneficial. Watch cholesterol and blood lipids Have your blood tested for lipids and cholesterol at 45 years of age, then have this test every 5 years. You may need to have your cholesterol levels checked more often if: Your lipid or cholesterol levels are high. You are older than 45 years of age. You are at high risk for heart disease. What should I know about cancer screening? Many types of cancers can be detected early and may often be prevented. Depending on your health history and family history, you may need to have cancer screening at various ages. This may include screening for: Colorectal cancer. Prostate cancer. Skin cancer. Lung  cancer. What should I know about heart disease, diabetes, and high blood pressure? Blood pressure and heart disease High blood pressure causes heart disease and increases the risk of stroke. This is more likely to develop in people who have high blood pressure readings or are overweight. Talk with your health care provider about your target blood pressure readings. Have your blood pressure checked: Every 3-5 years if you are 18-39 years of age. Every year if you are 40 years old or older. If you are between the ages of 65 and 75 and are a current or former smoker, ask your health care provider if you should have a one-time screening for abdominal aortic aneurysm (AAA). Diabetes Have regular diabetes screenings. This checks your fasting blood sugar level. Have the screening done: Once every three years after age 45 if you are at a normal weight and have a low risk for diabetes. More often and at a younger age if you are overweight or have a high risk for diabetes. What should I know about preventing infection? Hepatitis B If you have a higher risk for hepatitis B, you should be screened for this virus. Talk with your health care provider to find out if you are at risk for hepatitis B infection. Hepatitis C Blood testing is recommended for: Everyone born from 1945 through 1965. Anyone with known risk factors for hepatitis C. Sexually transmitted infections (STIs) You should be screened each year for STIs, including gonorrhea and chlamydia, if: You are sexually active and are younger than 45 years of age. You are older than 45 years of age and your   health care provider tells you that you are at risk for this type of infection. Your sexual activity has changed since you were last screened, and you are at increased risk for chlamydia or gonorrhea. Ask your health care provider if you are at risk. Ask your health care provider about whether you are at high risk for HIV. Your health care provider  may recommend a prescription medicine to help prevent HIV infection. If you choose to take medicine to prevent HIV, you should first get tested for HIV. You should then be tested every 3 months for as long as you are taking the medicine. Follow these instructions at home: Alcohol use Do not drink alcohol if your health care provider tells you not to drink. If you drink alcohol: Limit how much you have to 0-2 drinks a day. Know how much alcohol is in your drink. In the [place] one drink equals one 12 oz bottle of beer (355 mL), one 5 oz glass of wine (148 mL), or one 1 oz glass of hard liquor (44 mL). Lifestyle Do not use any products that contain nicotine or tobacco. These products include cigarettes, chewing tobacco, and vaping devices, such as e-cigarettes. If you need help quitting, ask your health care provider. Do not use street drugs. Do not share needles. Ask your health care provider for help if you need support or information about quitting drugs. General instructions Schedule regular health, dental, and eye exams. Stay current with your vaccines. Tell your health care provider if: You often feel depressed. You have ever been abused or do not feel safe at home. Summary Adopting a healthy lifestyle and getting preventive care are important in promoting health and wellness. Follow your health care provider's instructions about healthy diet, exercising, and getting tested or screened for diseases. Follow your health care provider's instructions on monitoring your cholesterol and blood pressure. This information is not intended to replace advice given to you by your health care provider. Make sure you discuss any questions you have with your health care provider. Document Revised: 01/15/2021 Document Reviewed: 01/15/2021 Elsevier Patient Education  2023 Elsevier Inc.  

## 2021-12-27 LAB — HIV ANTIBODY (ROUTINE TESTING W REFLEX): HIV 1&2 Ab, 4th Generation: NONREACTIVE

## 2021-12-27 LAB — HEPATITIS C ANTIBODY
Hepatitis C Ab: NONREACTIVE
SIGNAL TO CUT-OFF: 0.08 (ref <1.00)

## 2021-12-31 LAB — TESTT+TESTF+SHBG
Sex Hormone Binding: 34.4 nmol/L (ref 16.5–55.9)
Testosterone, Free: 6.1 pg/mL — ABNORMAL LOW (ref 6.8–21.5)
Testosterone, Total, LC/MS: 372.4 ng/dL (ref 264.0–916.0)

## 2022-01-11 NOTE — Addendum Note (Signed)
Addended by: Jerrell Belfast on: 01/11/2022 10:26 AM ? ? Modules accepted: Orders ? ?

## 2022-02-11 ENCOUNTER — Encounter: Payer: Self-pay | Admitting: Emergency Medicine

## 2022-02-11 NOTE — Telephone Encounter (Signed)
Citalopram is good for anxiety.  Does he feel like he needs it?

## 2022-02-12 ENCOUNTER — Other Ambulatory Visit: Payer: Self-pay | Admitting: Emergency Medicine

## 2022-02-12 DIAGNOSIS — F411 Generalized anxiety disorder: Secondary | ICD-10-CM

## 2022-02-12 MED ORDER — CITALOPRAM HYDROBROMIDE 10 MG PO TABS: 10.0000 mg | ORAL_TABLET | Freq: Every day | ORAL | 1 refills | Status: DC

## 2022-02-12 NOTE — Telephone Encounter (Signed)
New prescription for Celexa 10 mg sent to pharmacy of record.  Thanks.

## 2022-02-25 NOTE — Telephone Encounter (Signed)
My understanding was that he wanted to try the anxiety over-the-counter supplements before trying Celexa.  He can start Celexa anytime he wants.  Thanks.

## 2022-06-18 ENCOUNTER — Encounter: Payer: Self-pay | Admitting: Emergency Medicine

## 2022-06-18 DIAGNOSIS — F411 Generalized anxiety disorder: Secondary | ICD-10-CM

## 2022-07-04 ENCOUNTER — Encounter (HOSPITAL_COMMUNITY): Payer: Self-pay

## 2022-07-04 ENCOUNTER — Ambulatory Visit (HOSPITAL_BASED_OUTPATIENT_CLINIC_OR_DEPARTMENT_OTHER): Payer: 59 | Admitting: Student in an Organized Health Care Education/Training Program

## 2022-07-04 ENCOUNTER — Ambulatory Visit (HOSPITAL_COMMUNITY): Payer: 59 | Admitting: Psychiatry

## 2022-07-04 ENCOUNTER — Encounter (HOSPITAL_COMMUNITY): Payer: Self-pay | Admitting: Student in an Organized Health Care Education/Training Program

## 2022-07-04 ENCOUNTER — Telehealth (HOSPITAL_COMMUNITY): Payer: Self-pay | Admitting: Student in an Organized Health Care Education/Training Program

## 2022-07-04 VITALS — BP 122/74 | HR 74 | Resp 12 | Wt 191.0 lb

## 2022-07-04 DIAGNOSIS — F411 Generalized anxiety disorder: Secondary | ICD-10-CM

## 2022-07-04 DIAGNOSIS — F332 Major depressive disorder, recurrent severe without psychotic features: Secondary | ICD-10-CM | POA: Diagnosis not present

## 2022-07-04 DIAGNOSIS — G4700 Insomnia, unspecified: Secondary | ICD-10-CM

## 2022-07-04 DIAGNOSIS — F410 Panic disorder [episodic paroxysmal anxiety] without agoraphobia: Secondary | ICD-10-CM

## 2022-07-04 MED ORDER — TRAZODONE HCL 50 MG PO TABS: 50.0000 mg | ORAL_TABLET | Freq: Every day | ORAL | 3 refills | Status: DC

## 2022-07-04 MED ORDER — CITALOPRAM HYDROBROMIDE 20 MG PO TABS: 20.0000 mg | ORAL_TABLET | Freq: Every day | ORAL | 1 refills | Status: DC
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
## 2022-07-04 NOTE — Telephone Encounter (Signed)
I spoke with him, thanks! 

## 2022-07-04 NOTE — Telephone Encounter (Signed)
Patient called in asking if he is supposed to continue taking his current prescription of CELEXA along with the prescription you sent in today or does he discontinue his current one.  Phone # [phone]

## 2022-08-19 ENCOUNTER — Ambulatory Visit (HOSPITAL_BASED_OUTPATIENT_CLINIC_OR_DEPARTMENT_OTHER): Payer: 59 | Admitting: Student in an Organized Health Care Education/Training Program

## 2022-08-19 DIAGNOSIS — G4700 Insomnia, unspecified: Secondary | ICD-10-CM

## 2022-08-19 DIAGNOSIS — F411 Generalized anxiety disorder: Secondary | ICD-10-CM

## 2022-08-19 MED ORDER — TRAZODONE HCL 50 MG PO TABS: 50.0000 mg | ORAL_TABLET | Freq: Every day | ORAL | 1 refills | Status: DC

## 2022-08-19 MED ORDER — CITALOPRAM HYDROBROMIDE 20 MG PO TABS: 20.0000 mg | ORAL_TABLET | Freq: Every day | ORAL | 1 refills | Status: DC

## 2022-08-19 NOTE — Progress Notes (Signed)
Patient was 20 min late for appt, but did answer his phone due to confusion that it may be virtual. Patient was not able to do a virtual appt due to being in his work car and having a partner with him. Patient attempted to show up for appt, but was very late. Will provide refill and have instructed patient to reschedule. Patient endorsed understanding.   PGY-3 Eliseo Gum, MD

## 2022-09-30 ENCOUNTER — Ambulatory Visit (HOSPITAL_COMMUNITY): Payer: 59 | Admitting: Student in an Organized Health Care Education/Training Program

## 2022-10-21 ENCOUNTER — Ambulatory Visit (HOSPITAL_BASED_OUTPATIENT_CLINIC_OR_DEPARTMENT_OTHER): Payer: 59 | Admitting: Student in an Organized Health Care Education/Training Program

## 2022-10-21 ENCOUNTER — Encounter (HOSPITAL_COMMUNITY): Payer: Self-pay | Admitting: Student in an Organized Health Care Education/Training Program

## 2022-10-21 DIAGNOSIS — F411 Generalized anxiety disorder: Secondary | ICD-10-CM

## 2022-10-21 DIAGNOSIS — G4700 Insomnia, unspecified: Secondary | ICD-10-CM

## 2022-10-21 MED ORDER — TRAZODONE HCL 50 MG PO TABS: 50.0000 mg | ORAL_TABLET | Freq: Every day | ORAL | 1 refills | Status: DC

## 2022-10-21 MED ORDER — CITALOPRAM HYDROBROMIDE 10 MG PO TABS: 30.0000 mg | ORAL_TABLET | Freq: Every day | ORAL | 3 refills | Status: DC

## 2022-10-21 NOTE — Progress Notes (Signed)
BH MD/PA/NP OP Progress Note  10/21/2022 7:08 PM Jason Decker  MRN:  [id number]  Chief Complaint: No chief complaint on file.  HPI: Jason Decker is a 47 yo patient with PPH of GAD dx by his PCP.  Patient reports he has been compliant with the prescribed treatment below:   Celexa 56m daily Trazodone 531mQHS  Patient reports that he has been compliant with his medications. Patient reports that his mood is "up and down." Patient report sthat he does feel like he is getting better, but does have days where things "get to me." Patient reports that he is still overthinking things that he thinks may not be as a big of deal as he is making it. Patient reports that this is worse when he is idle. Patient reports that he did see an improvement when the Celexa was increased and his anxiety was almost completely gone, but it is slowly creeping in. Patient reports that he is trying to do what his therapists tells him, and it is helpful but he is still bothered. Patient reports that overall he does feel better then 1 year ago. Patient reports that he has at least 1 panic attack since last seeing provider where he has tachypnea, tachycardia, and restlessness; however, he is much less concerned and preoccupied at the thought of having panic attacks in the future. He does try to redirect himself to avoid these happening frequently. Patient reports he is meditating and working out and denies anhedonia.   Patient reports that he is much less irritable, and feels more in control with his emotions. He is also trying to isolate less at home, when he is upset. He is trying to work on "feeling" more because he is this was not how he was for most of his life, when he led a more high-risk lifestyle.  Patient reports he is still in a bit of fear regarding how he behaves when he suddenly becomes more anxious at home.  Patient denies becoming violent but endorses that he may either isolate or lash out verbally he does not like  these behaviors and himself.  Patient started [facility]. He is learning to cope with and communicate his emotions.  He also reports that therapy has helped him adjust to his job, now he is more focusing on his emotions and responses at home..   Patient denies SI, HI, and AVH.   Visit Diagnosis:    ICD-10-CM   1. Generalized anxiety disorder  F41.1 citalopram (CELEXA) 10 MG tablet    2. Insomnia, unspecified type  G47.00 traZODone (DESYREL) 50 MG tablet      Past Psychiatric History: INPT: Denies OUTPT: denies Therapy: started this year and is current Previous meds: Celexa and Trazodone No hx of SA  Last visit 06/2022-increase patient's Celexa to 20 mg due to continued anxiety symptoms  Past Medical History: History reviewed. No pertinent past medical history. History reviewed. No pertinent surgical history.  Family Psychiatric History: Mom: Etoh use disorder and Anxiety M Aunt: Anxiety Cousin: SA Uncles: SUD    Family History: History reviewed. No pertinent family history.  Social History:  Social History   Socioeconomic History   Marital status: Married    Spouse name: Not on file   Number of children: Not on file   Years of education: Not on file   Highest education level: Not on file  Occupational History   Not  on file  Tobacco Use   Smoking status: Every Day    Types: E-cigarettes   Smokeless tobacco: Never  Vaping Use   Vaping Use: Never used  Substance and Sexual Activity   Alcohol use: Yes   Drug use: Never   Sexual activity: Not on file  Other Topics Concern   Not on file  Social History Narrative   ** Merged History Encounter **       Social Determinants of Health   Financial Resource Strain: Not on file  Food Insecurity: Not on file  Transportation Needs: Not on file  Physical Activity: Not on file  Stress: Not on file  Social Connections: Not on file     Allergies:  Allergies  Allergen Reactions   Shellfish Allergy Anaphylaxis    Metabolic Disorder Labs: Lab Results  Component Value Date   HGBA1C 5.3 12/26/2021   No results found for: "PROLACTIN" Lab Results  Component Value Date   CHOL 138 12/26/2021   TRIG 60.0 12/26/2021   HDL 47.00 12/26/2021   CHOLHDL 3 12/26/2021   VLDL 12.0 12/26/2021   LDLCALC 79 12/26/2021   LDLCALC 78 09/13/2021   Lab Results  Component Value Date   TSH 0.65 09/13/2021   TSH 0.966 05/31/2019    Therapeutic Level Labs: No results found for: "LITHIUM" No results found for: "VALPROATE" No results found for: "CBMZ"  Current Medications: Current Outpatient Medications  Medication Sig Dispense Refill   blood glucose meter kit and supplies KIT Dispense based on patient and insurance preference. Use up to four times daily as directed. (FOR ICD-9 250.00, 250.01). 1 each 0   citalopram (CELEXA) 10 MG tablet Take 3 tablets (30 mg total) by mouth daily. 90 tablet 3   cyclobenzaprine (FLEXERIL) 10 MG tablet Take 1 tablet (10 mg total) by mouth 2 (two) times daily as needed for muscle spasms. (Patient not taking: Reported on 10/11/2020) 20 tablet 0   diphenhydrAMINE (BENADRYL) 25 mg capsule Take 50 mg by mouth every 6 (six) hours as needed. (Patient not taking: Reported on 12/26/2021)     EPINEPHrine (AUVI-Q) 0.3 mg/0.3 mL IJ SOAJ injection Inject 0.3 mLs (0.3 mg total) into the muscle as needed for anaphylaxis. (Patient not taking: Reported on 10/17/2021) 2 each 2   glipiZIDE (GLUCOTROL) 5 MG tablet Take 1 tablet (5 mg total) by mouth 2 (two) times daily before a meal. 180 tablet 1   glucose blood test strip Use as instructed 100 each 12   hydrOXYzine (ATARAX) 25 MG tablet Take 1 tablet (25 mg total) by mouth 3 (three) times daily as needed. (Patient not taking: Reported on 12/26/2021) 30 tablet 0   levocetirizine (XYZAL) 5 MG tablet Take 1 tablet (5 mg total) by mouth every evening. (Patient not taking:  Reported on 12/26/2021) 90 tablet 1   LORazepam (ATIVAN) 0.5 MG tablet Take 0.5 tablets (0.25 mg total) by mouth 2 (two) times daily. (Patient not taking: Reported on 12/26/2021) 3 tablet 0   metFORMIN (GLUCOPHAGE) 500 MG tablet Take 1 tablet (500 mg total) by mouth 2 (two) times daily with a meal. (Patient not taking: Reported on 12/26/2021) 180 tablet 3   methocarbamol (ROBAXIN) 500 MG tablet Take 1 tablet (500 mg total) by mouth 4 (four) times daily. (Patient not taking: Reported on 09/13/2021) 60 tablet 0   rosuvastatin (CRESTOR) 20 MG tablet Take 1 tablet (20 mg total) by mouth daily. (Patient not taking: Reported on 12/26/2021) 90 tablet 3   traZODone ([place])  50 MG tablet Take 1 tablet (50 mg total) by mouth at bedtime. 90 tablet 1   No current facility-administered medications for this visit.     Musculoskeletal: Strength & Muscle Tone: within normal limits Gait & Station: normal Patient leans: N/A  Psychiatric Specialty Exam: Review of Systems  Psychiatric/Behavioral:  Negative for dysphoric mood, hallucinations, sleep disturbance and suicidal ideas. The patient is nervous/anxious.     Blood pressure (!) 146/83, pulse 78, weight 180 lb (81.6 kg), SpO2 98 %.Body mass index is 25.1 kg/m.  General Appearance: Casual  Eye Contact:  Good  Speech:  Clear and Coherent  Volume:  Normal  Mood:  Anxious  Affect:  Appropriate  Thought Process:  Coherent  Orientation:  Full (Time, Place, and Person)  Thought Content: Logical   Suicidal Thoughts:  No  Homicidal Thoughts:  No  Memory:  Immediate;   Good Recent;   Good  Judgement:  Good  Insight:  Fair  Psychomotor Activity:  Normal  Concentration:  Concentration: Good  Recall:  Good  Fund of Knowledge: Good  Language: Good  Akathisia:  NA  Handed:    AIMS (if indicated): not done  Assets:  Communication Skills Desire for Improvement Housing Intimacy Leisure Time Resilience Social  Support Transportation Vocational/Educational  ADL's:  Intact  Cognition: WNL  Sleep:  Good   Screenings: PHQ2-9    [place] Office Visit from 12/26/2021 in [place] [facility] [place] Visit from 09/13/2021 in [place] [facility] [place] Visit from 10/11/2020 in [place] [facility] [place] from 07/12/2020 in [facility] at [place] from 09/22/2019 in [facility] Care at [facility] Total Score 0 2 0 0 0      [place] [facility] from 08/27/2021 in [facility] Emergency Department at [place] No Risk        Assessment and Plan: Dedric Lalama is a 47 yo patient with PPH of GAD dx by his PCP.  Patient continues to endorse benefit from medication however he is still having significant anxiety although now his symptoms are more ruminative behaviors worse when idle.  Patient appears to be benefiting from dual therapy and medication management as he is learning how to identify and process his emotions and his insight is significantly improving.  Patient would likely benefit from increase in his Celexa to address continued anxiety symptoms as well as the occasional irritability that he notes that his anxiety is worse.  Patient appears to be sleeping well on trazodone.  Of note, patient was much less distractible today and was able to focus on assessment.  MDD, single episode, severe-resolved GAD Panic disorder-resolved - Increase Celexa to 40m daily - Continue therapy     Associated insomnia - Trazodone 523mQHS PRN  Collaboration of Care: Collaboration of Care:   Patient/Guardian was advised Release of Information must be obtained prior to any record release in order to collaborate their care with an outside provider. Patient/Guardian was advised if they have not already done so to contact the registration department to sign all necessary forms in order for [place] release information  regarding their care.   Consent: Patient/Guardian gives verbal consent for treatment and assignment of benefits for services provided during this visit. Patient/Guardian expressed understanding and agreed to proceed.    JaFreida BusmanMD 10/21/2022, 7:08 PM

## 2023-01-22 ENCOUNTER — Telehealth (HOSPITAL_COMMUNITY): Payer: Self-pay | Admitting: *Deleted

## 2023-01-22 ENCOUNTER — Other Ambulatory Visit (HOSPITAL_COMMUNITY): Payer: Self-pay | Admitting: Student in an Organized Health Care Education/Training Program

## 2023-01-22 ENCOUNTER — Ambulatory Visit (HOSPITAL_COMMUNITY): Payer: 59 | Admitting: Student in an Organized Health Care Education/Training Program

## 2023-01-22 DIAGNOSIS — F411 Generalized anxiety disorder: Secondary | ICD-10-CM

## 2023-01-22 MED ORDER — CITALOPRAM HYDROBROMIDE 10 MG PO TABS: 30.0000 mg | ORAL_TABLET | Freq: Every day | ORAL | 1 refills | Status: DC

## 2023-01-22 NOTE — Telephone Encounter (Signed)
Rx REFILL REQUEST-- [id number] - [place], [place] - [address]  [address],  [place] [address]    citalopram (CELEXA) 10 MG tablet [[id number]]   Order Details Dose: 30 mg Route: Oral Frequency: Daily  Dispense Quantity: 90 tablet Refills: 3        Sig: Take 3 tablets (30 mg total) by mouth daily.       Start Date: 10/21/22 End Date: --  Written Date: 10/21/22 Expiration Date: 10/21/23     Associated Diagnoses: Generalized anxiety disorder [F41.1]  Original Order: citalopram (CELEXA) 20 MG tablet

## 2023-01-22 NOTE — Telephone Encounter (Signed)
Done

## 2023-02-13 ENCOUNTER — Telehealth (HOSPITAL_BASED_OUTPATIENT_CLINIC_OR_DEPARTMENT_OTHER): Payer: 59 | Admitting: Student in an Organized Health Care Education/Training Program

## 2023-02-13 DIAGNOSIS — F411 Generalized anxiety disorder: Secondary | ICD-10-CM

## 2023-02-13 DIAGNOSIS — F410 Panic disorder [episodic paroxysmal anxiety] without agoraphobia: Secondary | ICD-10-CM | POA: Insufficient documentation

## 2023-02-13 DIAGNOSIS — G4700 Insomnia, unspecified: Secondary | ICD-10-CM

## 2023-02-13 DIAGNOSIS — F17290 Nicotine dependence, other tobacco product, uncomplicated: Secondary | ICD-10-CM

## 2023-02-13 HISTORY — DX: Panic disorder (episodic paroxysmal anxiety): F41.0

## 2023-02-13 MED ORDER — TRAZODONE HCL 50 MG PO TABS: 50.0000 mg | ORAL_TABLET | Freq: Every day | ORAL | 1 refills | Status: DC

## 2023-02-13 MED ORDER — NICOTINE 21 MG/24HR TD PT24: 21.0000 mg | MEDICATED_PATCH | Freq: Every day | TRANSDERMAL | 2 refills | Status: DC

## 2023-02-13 MED ORDER — CITALOPRAM HYDROBROMIDE 20 MG PO TABS: 20.0000 mg | ORAL_TABLET | Freq: Every day | ORAL | 2 refills | Status: DC

## 2023-02-13 NOTE — Progress Notes (Signed)
Virtual Visit via Video Note  I connected with Jason Decker on 02/13/23 at  3:00 PM EDT by a video enabled telemedicine application and verified that I am speaking with the correct person using two identifiers.  Location: Patient: Set designer, parked Provider: Office   I discussed the limitations of evaluation and management by telemedicine and the availability of in person appointments. The patient expressed understanding and agreed to proceed.     I discussed the assessment and treatment plan with the patient. The patient was provided an opportunity to ask questions and all were answered. The patient agreed with the plan and demonstrated an understanding of the instructions.   The patient was advised to call back or seek an in-person evaluation if the symptoms worsen or if the condition fails to improve as anticipated.  I provided 30 minutes of non-face-to-face time during this encounter.   Jason Morton, MD  [facility] MD/PA/NP OP Progress Note  02/13/2023 5:30 PM Jason Decker  MRN:  [id number]  Chief Complaint:  Chief Complaint  Patient presents with   Follow-up   HPI: Jason Decker is a 47 yo patient with PPH of GAD dx by his PCP.  Patient reports he has been compliant with the prescribed treatment below:   Celexa 20mg  daily Trazodone 50mg  QHS  Patient reports that he is doing well. Patient reports that he did not handle Celexa 30mg  well, but he has been taking 20mg  and has been doing well. Patient reports that he felt really tired and spacey on 30mg , after one time and actually needed his co-workers to help him. Patient reports that he does feel like his anxiety is well managed. Patient reports that he has had more insight into where his stress is coming from. Patient reports he has been working on setting boundaries and not taking his work stress on him nor take it home. Patient is now going out with friends and trying to give himself more time to do things he he enjoys. Patient reports that  he realizes that he likely had some anhedonia before and his wife mentioned that he seemed to not have joy in life anymore, but now he is able to find joy in things.   Patient reports that he does think he is having more good days than bad days. Patient reports that he rare fleeting passive SI. Patient reports that he is still occasionally worried about panic attacks. Patient is still evident to take food from others because he had a panic attacks after taking an edible. Patient reports its worse when he stays up late and does not get enough rest. Patient reports that he can't even drink water if he leaves it out in the office. Patient reports that he still finds it hard to fall asleep, the trazodone does help. Patient thinks his thoughts keep him up. Patient reports he averaging 10P-5A of sleep.  Patient denies feeling constantly on edge.   Patient reports that he may average 1week/ month where he has fear of a panic attack.  Vaping- patient willing to stop.   Patient denies HI and AVH.   Visit Diagnosis:    ICD-10-CM   1. Generalized anxiety disorder  F41.1 citalopram (CELEXA) 20 MG tablet    2. Other tobacco product nicotine dependence, uncomplicated  F17.290 nicotine (NICODERM CQ - DOSED IN MG/24 HOURS) 21 mg/24hr patch    3. Panic disorder  F41.0     4. Insomnia, unspecified type  G47.00 traZODone (DESYREL) 50 MG tablet  Past Psychiatric History:  INPT: Denies OUTPT: denies Therapy: started this year and is current Previous meds: Celexa and Trazodone No hx of SA   Last visit 06/2022-increase patient's Celexa to 20 mg due to continued anxiety symptoms 10/2022- increased celexa to 30mg  still having some ruminative thoughts but having improvement. MDD and panic d/o were resolved.     Past Medical History: No past medical history on file. No past surgical history on file.  Family Psychiatric History: Mom: Etoh use disorder and Anxiety M Aunt: Anxiety Cousin: SA Uncles:  SUD  Family History: No family history on file.  Social History:  Social History   Socioeconomic History   Marital status: Married    Spouse name: Not on file   Number of children: Not on file   Years of education: Not on file   Highest education level: Not on file  Occupational History   Not on file  Tobacco Use   Smoking status: Every Day    Types: E-cigarettes   Smokeless tobacco: Never  Vaping Use   Vaping Use: Never used  Substance and Sexual Activity   Alcohol use: Yes   Drug use: Never   Sexual activity: Not on file  Other Topics Concern   Not on file  Social History Narrative   ** Merged History Encounter **       Social Determinants of Health   Financial Resource Strain: Not on file  Food Insecurity: Not on file  Transportation Needs: Not on file  Physical Activity: Not on file  Stress: Not on file  Social Connections: Not on file    Allergies:  Allergies  Allergen Reactions   Shellfish Allergy Anaphylaxis    Metabolic Disorder Labs: Lab Results  Component Value Date   HGBA1C 5.3 12/26/2021   No results found for: "PROLACTIN" Lab Results  Component Value Date   CHOL 138 12/26/2021   TRIG 60.0 12/26/2021   HDL 47.00 12/26/2021   CHOLHDL 3 12/26/2021   VLDL 12.0 12/26/2021   LDLCALC 79 12/26/2021   LDLCALC 78 09/13/2021   Lab Results  Component Value Date   TSH 0.65 09/13/2021   TSH 0.966 05/31/2019    Therapeutic Level Labs: No results found for: "LITHIUM" No results found for: "VALPROATE" No results found for: "CBMZ"  Current Medications: Current Outpatient Medications  Medication Sig Dispense Refill   nicotine (NICODERM CQ - DOSED IN MG/24 HOURS) 21 mg/24hr patch Place 1 patch (21 mg total) onto the skin daily. 28 patch 2   blood glucose meter kit and supplies KIT Dispense based on patient and insurance preference. Use up to four times daily as directed. (FOR ICD-9 250.00, 250.01). 1 each 0   citalopram (CELEXA) 20 MG tablet  Take 1 tablet (20 mg total) by mouth daily. 30 tablet 2   cyclobenzaprine (FLEXERIL) 10 MG tablet Take 1 tablet (10 mg total) by mouth 2 (two) times daily as needed for muscle spasms. (Patient not taking: Reported on 10/11/2020) 20 tablet 0   diphenhydrAMINE (BENADRYL) 25 mg capsule Take 50 mg by mouth every 6 (six) hours as needed. (Patient not taking: Reported on 12/26/2021)     EPINEPHrine (AUVI-Q) 0.3 mg/0.3 mL IJ SOAJ injection Inject 0.3 mLs (0.3 mg total) into the muscle as needed for anaphylaxis. (Patient not taking: Reported on 10/17/2021) 2 each 2   glipiZIDE (GLUCOTROL) 5 MG tablet Take 1 tablet (5 mg total) by mouth 2 (two) times daily before a meal. 180 tablet 1   glucose  blood test strip Use as instructed 100 each 12   hydrOXYzine (ATARAX) 25 MG tablet Take 1 tablet (25 mg total) by mouth 3 (three) times daily as needed. (Patient not taking: Reported on 12/26/2021) 30 tablet 0   levocetirizine (XYZAL) 5 MG tablet Take 1 tablet (5 mg total) by mouth every evening. (Patient not taking: Reported on 12/26/2021) 90 tablet 1   LORazepam (ATIVAN) 0.5 MG tablet Take 0.5 tablets (0.25 mg total) by mouth 2 (two) times daily. (Patient not taking: Reported on 12/26/2021) 3 tablet 0   metFORMIN (GLUCOPHAGE) 500 MG tablet Take 1 tablet (500 mg total) by mouth 2 (two) times daily with a meal. (Patient not taking: Reported on 12/26/2021) 180 tablet 3   methocarbamol (ROBAXIN) 500 MG tablet Take 1 tablet (500 mg total) by mouth 4 (four) times daily. (Patient not taking: Reported on 09/13/2021) 60 tablet 0   rosuvastatin (CRESTOR) 20 MG tablet Take 1 tablet (20 mg total) by mouth daily. (Patient not taking: Reported on 12/26/2021) 90 tablet 3   traZODone (DESYREL) 50 MG tablet Take 1 tablet (50 mg total) by mouth at bedtime. 90 tablet 1   No current facility-administered medications for this visit.       Psychiatric Specialty Exam: Review of Systems  Psychiatric/Behavioral:  Negative for dysphoric mood,  hallucinations, sleep disturbance and suicidal ideas.     There were no vitals taken for this visit.There is no height or weight on file to calculate BMI.  General Appearance: Well Groomed  Eye Contact:  Good  Speech:  Clear and Coherent  Volume:  Normal  Mood:  Euthymic  Affect:  Appropriate  Thought Process:  Coherent  Orientation:  Full (Time, Place, and Person)  Thought Content: Logical   Suicidal Thoughts:  No  Homicidal Thoughts:  No  Memory:  Immediate;   Good Recent;   Good  Judgement:  Good  Insight:  Good  Psychomotor Activity:  Normal  Concentration:  Concentration: Good  Recall:  Good  Fund of Knowledge: Good  Language: Good  Akathisia:  NA  Handed:    AIMS (if indicated): not done  Assets:  Communication Skills Desire for Improvement Financial Resources/Insurance Housing Intimacy Leisure Time Resilience Social Support Transportation Vocational/Educational  ADL's:  Intact  Cognition: WNL  Sleep:  Good   Screenings: PHQ2-9    Flowsheet Row Office Visit from [facility]26/20[facility] in [facility] [place] Office Visit from 09/13/2021 in [facility] [place] Office Visit from 10/11/2020 in [facility] at [facility] Visit from 07/12/2020 in [facility] at [facility] Visit from 09/22/2019 in Primary Care at [facility] Total Score 0 2 0 0 0      Flowsheet Row ED from 08/27/2021 in [facility] Emergency Department at [facility]  C-SSRS RISK CATEGORY No Risk        Assessment and Plan: Patient appears to be doing fairly well on current medication regimen.  While patient does continue to endorse some anxiety and symptoms of panic disorder this may likely improve with cessation of nicotine and sleep hygiene.  Patient's worsened anxiety is usually when he is sleeping less, although this is due to patient making the decision to sleep much later than normal.  Patient sleeps well as long as he takes his trazodone  and goes to bed around 10 PM.  Patient also endorsed that he would like to minimize the amount of medication he is on and ultimately try to  titrate off his medication in the future.  Discussion was had with patient to continue his medication at least 1 year from 06/2022, patient was on board with this and is also continuing therapy.  Nicotine dependence -Start 21 mg NRT patch  GAD- stable Panic disorder- improving - continue celexa 20mg  daily - continue trazodone 50mg  QHS PRN - sleep hygiene  Follow-up with Dr. Hazle Quant in 04/2023.  Discussed with patient pending transition of care to a new resident starting July 1st, and likely discontinuation of care by this provider at that time.    Collaboration of Care: Collaboration of Care:   Patient/Guardian was advised Release of Information must be obtained prior to any record release in order to collaborate their care with an outside provider. Patient/Guardian was advised if they have not already done so to contact the registration department to sign all necessary forms in order for [place] to release information regarding their care.   Consent: Patient/Guardian gives verbal consent for treatment and assignment of benefits for services provided during this visit. Patient/Guardian expressed understanding and agreed to proceed.   PGY-3 Jason Morton, MD 02/13/2023, 5:30 PM

## 2023-02-14 NOTE — Addendum Note (Signed)
Addended by: Everlena Cooper on: 02/14/2023 08:31 AM   Modules accepted: Level of Service

## 2023-04-09 ENCOUNTER — Ambulatory Visit (HOSPITAL_COMMUNITY): Payer: 59 | Admitting: Student

## 2023-04-23 ENCOUNTER — Ambulatory Visit (HOSPITAL_COMMUNITY): Payer: 59 | Admitting: Student

## 2023-04-30 ENCOUNTER — Ambulatory Visit (HOSPITAL_BASED_OUTPATIENT_CLINIC_OR_DEPARTMENT_OTHER): Payer: 59 | Admitting: Student

## 2023-04-30 DIAGNOSIS — G4700 Insomnia, unspecified: Secondary | ICD-10-CM

## 2023-04-30 DIAGNOSIS — F411 Generalized anxiety disorder: Secondary | ICD-10-CM

## 2023-04-30 MED ORDER — TRAZODONE HCL 50 MG PO TABS
50.0000 mg | ORAL_TABLET | Freq: Every day | ORAL | 2 refills | Status: DC
Start: 2023-04-30 — End: 2023-08-18

## 2023-04-30 MED ORDER — CITALOPRAM HYDROBROMIDE 20 MG PO TABS: 20.0000 mg | ORAL_TABLET | Freq: Every day | ORAL | 2 refills | Status: DC

## 2023-04-30 NOTE — Progress Notes (Unsigned)
BH MD Outpatient Progress Note  04/30/2023 1:41 PM Jason Decker  MRN:  [id number]  Assessment:  Jason Decker presents for follow-up evaluation in-person. Today, 04/30/23, patient reports continued stability with current citalopram dose and continued use of trazodone for sleep. He will follow up in 2 months and if continue to be stable will request that PCP continue prescribing current psychotropic regiment. He plans to see PCP to get lab draws before next appointment with me.    Identifying Information: Jason Decker is a 47 y.o. male with a history of GAD w/ panic disorder and dyslipidemia who is an established patient with [facility] for medication management.   Plan:  # GAD w/ panic Past medication trials:  Status of problem: *** Interventions: -- ***  # *** Past medication trials:  Status of problem: *** Interventions: -- ***  # *** Past medication trials:  Status of problem: *** Interventions: -- ***  Return to care in ***  Patient was given contact information for behavioral health clinic and was instructed to call 911 for emergencies.    Patient and plan of care will be discussed with the Attending MD, Dr. ***, who agrees with the above statement and plan.   Subjective:  Chief Complaint: Medication Management   Interval History:  He reports being fearful of becoming as anxious as he did 19 months ago.  Visit Diagnosis:    ICD-10-CM   1. Generalized anxiety disorder  F41.1 citalopram (CELEXA) 20 MG tablet    2. Insomnia, unspecified type  G47.00 traZODone (DESYREL) 50 MG tablet      Past Psychiatric History:  Diagnoses: *** Medication trials: *** Previous psychiatrist/therapist: *** Hospitalizations: *** Suicide attempts: *** SIB: *** Hx of violence towards others: *** Current access to guns: *** Hx of trauma/abuse: *** Substance use: ***  Past Medical History:  No past medical history on file.  No past surgical history on  file.  Family Psychiatric History: ***  Family History:  No family history on file.  Social History:  Academic/Vocational: *** Social History   Socioeconomic History  . Marital status: Married    Spouse name: Not on file  . Number of children: Not on file  . Years of education: Not on file  . Highest education level: Not on file  Occupational History  . Not on file  Tobacco Use  . Smoking status: Every Day    Types: E-cigarettes  . Smokeless tobacco: Never  Vaping Use  . Vaping status: Never Used  Substance and Sexual Activity  . Alcohol use: Yes  . Drug use: Never  . Sexual activity: Not on file  Other Topics Concern  . Not on file  Social History Narrative   ** Merged History Encounter **       Social Determinants of Health   Financial Resource Strain: Not on file  Food Insecurity: Not on file  Transportation Needs: Not on file  Physical Activity: Not on file  Stress: Not on file  Social Connections: Not on file    Allergies:  Allergies  Allergen Reactions  . Shellfish Allergy Anaphylaxis    Current Medications: Current Outpatient Medications  Medication Sig Dispense Refill  . blood glucose meter kit and supplies KIT Dispense based on patient and insurance preference. Use up to four times daily as directed. (FOR ICD-9 250.00, 250.01). 1 each 0  . citalopram (CELEXA) 20 MG tablet Take 1 tablet (20 mg total) by mouth daily. 30 tablet 2  . cyclobenzaprine (FLEXERIL)  10 MG tablet Take 1 tablet (10 mg total) by mouth 2 (two) times daily as needed for muscle spasms. (Patient not taking: Reported on 10/11/2020) 20 tablet 0  . diphenhydrAMINE (BENADRYL) 25 mg capsule Take 50 mg by mouth every 6 (six) hours as needed. (Patient not taking: Reported on 12/26/2021)    . EPINEPHrine (AUVI-Q) 0.3 mg/0.3 mL IJ SOAJ injection Inject 0.3 mLs (0.3 mg total) into the muscle as needed for anaphylaxis. (Patient not taking: Reported on 10/17/2021) 2 each 2  . glipiZIDE (GLUCOTROL) 5  MG tablet Take 1 tablet (5 mg total) by mouth 2 (two) times daily before a meal. 180 tablet 1  . glucose blood test strip Use as instructed 100 each 12  . hydrOXYzine (ATARAX) 25 MG tablet Take 1 tablet (25 mg total) by mouth 3 (three) times daily as needed. (Patient not taking: Reported on 12/26/2021) 30 tablet 0  . levocetirizine (XYZAL) 5 MG tablet Take 1 tablet (5 mg total) by mouth every evening. (Patient not taking: Reported on 12/26/2021) 90 tablet 1  . LORazepam (ATIVAN) 0.5 MG tablet Take 0.5 tablets (0.25 mg total) by mouth 2 (two) times daily. (Patient not taking: Reported on 12/26/2021) 3 tablet 0  . metFORMIN (GLUCOPHAGE) 500 MG tablet Take 1 tablet (500 mg total) by mouth 2 (two) times daily with a meal. (Patient not taking: Reported on 12/26/2021) 180 tablet 3  . methocarbamol (ROBAXIN) 500 MG tablet Take 1 tablet (500 mg total) by mouth 4 (four) times daily. (Patient not taking: Reported on 09/13/2021) 60 tablet 0  . nicotine (NICODERM CQ - DOSED IN MG/24 HOURS) 21 mg/24hr patch Place 1 patch (21 mg total) onto the skin daily. 28 patch 2  . rosuvastatin (CRESTOR) 20 MG tablet Take 1 tablet (20 mg total) by mouth daily. (Patient not taking: Reported on 12/26/2021) 90 tablet 3  . traZODone (DESYREL) 50 MG tablet Take 1 tablet (50 mg total) by mouth at bedtime. 30 tablet 2   No current facility-administered medications for this visit.    ROS: Review of Systems ***  Objective:  Psychiatric Specialty Exam: Blood pressure 129/80, pulse 66.There is no height or weight on file to calculate BMI.  General Appearance: {Appearance:22683}  Eye Contact: {BHH EYE CONTACT:22684}  Speech:  {Speech:22685}  Volume:  {Volume (PAA):22686}  Mood:  {BHH MOOD:22306}  Affect:  {Affect (PAA):22687}  Thought Content: {Thought Content:22690}   Suicidal Thoughts:  {ST/HT (PAA):22692}  Homicidal Thoughts:  {ST/HT (PAA):22692}  Thought Process:  {Thought Process (PAA):22688}  Orientation:  {BHH ORIENTATION  (PAA):22689}    Memory: {BHH MEMORY:22881}  Judgment:  {Judgement (PAA):22694}  Insight:  {Insight (PAA):22695}  Concentration:  {Concentration:21399}  Recall: not formally assessed ***  Fund of Knowledge: {BHH GOOD/FAIR/POOR:22877}  Language: {BHH GOOD/FAIR/POOR:22877}  Psychomotor Activity:  {Psychomotor (PAA):22696}  Akathisia:  {BHH YES OR NO:22294}  AIMS (if indicated): {Desc; done/not:10129}  Assets:  {Assets (PAA):22698}  ADL's:  {BHH ZOX'W:96045}  Cognition: {chl bhh cognition:304700322}  Sleep:  {BHH GOOD/FAIR/POOR:22877}   PE: General: well-appearing; no acute distress *** Pulm: no increased work of breathing on room air *** Strength & Muscle Tone: {desc; muscle tone:32375} Neuro: no focal neurological deficits observed *** Gait & Station: {PE GAIT ED WUJW:11914}  Metabolic Disorder Labs: Lab Results  Component Value Date   HGBA1C 5.3 12/26/2021   No results found for: "PROLACTIN" Lab Results  Component Value Date   CHOL 138 12/26/2021   TRIG 60.0 12/26/2021   HDL 47.00 12/26/2021   CHOLHDL 3  12/26/2021   VLDL 12.0 12/26/2021   LDLCALC 79 12/26/2021   LDLCALC 78 09/13/2021   Lab Results  Component Value Date   TSH 0.65 09/13/2021   TSH 0.966 05/31/2019    Therapeutic Level Labs: No results found for: "LITHIUM" No results found for: "VALPROATE" No results found for: "CBMZ"  Screenings: PHQ2-9    Flowsheet Row Office Visit from 12/26/2021 in [facility] [place] Office Visit from 09/13/2021 in [facility] [place] Office Visit from 10/11/2020 in Primary Care at [facility] Visit from 07/12/2020 in [facility] Care at [facility] Visit from 09/22/2019 in Primary Care at [facility] Total Score 0 2 0 0 0      Flowsheet Row ED from 08/27/2021 in [facility] Emergency Department at [facility]  C-SSRS RISK CATEGORY No Risk       Collaboration of Care: Collaboration of Care:  Patient/Guardian  was advised Release of Information must be obtained prior to any record release in order to collaborate their care with an outside provider. Patient/Guardian was advised if they have not already done so to contact the registration department to sign all necessary forms in order for [place] to release information regarding their care.   Consent: Patient/Guardian gives verbal consent for treatment and assignment of benefits for services provided during this visit. Patient/Guardian expressed understanding and agreed to proceed.   Park Pope, MD 04/30/2023, 1:41 PM

## 2023-05-01 ENCOUNTER — Encounter (HOSPITAL_COMMUNITY): Payer: Self-pay | Admitting: Student

## 2023-06-30 ENCOUNTER — Ambulatory Visit (HOSPITAL_BASED_OUTPATIENT_CLINIC_OR_DEPARTMENT_OTHER): Payer: 59 | Admitting: Student

## 2023-06-30 DIAGNOSIS — F411 Generalized anxiety disorder: Secondary | ICD-10-CM

## 2023-06-30 NOTE — Progress Notes (Unsigned)
No show

## 2023-07-08 ENCOUNTER — Encounter: Payer: Self-pay | Admitting: Emergency Medicine

## 2023-07-08 ENCOUNTER — Ambulatory Visit: Payer: 59 | Admitting: Emergency Medicine

## 2023-07-08 VITALS — BP 126/88 | HR 81 | Temp 98.5°F | Ht 71.0 in | Wt 193.1 lb

## 2023-07-08 DIAGNOSIS — Z125 Encounter for screening for malignant neoplasm of prostate: Secondary | ICD-10-CM

## 2023-07-08 DIAGNOSIS — Z130 Encounter for screening for diseases of the blood and blood-forming organs and certain disorders involving the immune mechanism: Secondary | ICD-10-CM

## 2023-07-08 DIAGNOSIS — Z13220 Encounter for screening for lipoid disorders: Secondary | ICD-10-CM

## 2023-07-08 DIAGNOSIS — Z13228 Encounter for screening for other metabolic disorders: Secondary | ICD-10-CM | POA: Diagnosis not present

## 2023-07-08 DIAGNOSIS — E1169 Type 2 diabetes mellitus with other specified complication: Secondary | ICD-10-CM

## 2023-07-08 DIAGNOSIS — F411 Generalized anxiety disorder: Secondary | ICD-10-CM

## 2023-07-08 DIAGNOSIS — Z1329 Encounter for screening for other suspected endocrine disorder: Secondary | ICD-10-CM | POA: Diagnosis not present

## 2023-07-08 DIAGNOSIS — Z0001 Encounter for general adult medical examination with abnormal findings: Secondary | ICD-10-CM

## 2023-07-08 DIAGNOSIS — E785 Hyperlipidemia, unspecified: Secondary | ICD-10-CM

## 2023-07-08 DIAGNOSIS — Z7984 Long term (current) use of oral hypoglycemic drugs: Secondary | ICD-10-CM | POA: Diagnosis not present

## 2023-07-08 DIAGNOSIS — Z23 Encounter for immunization: Secondary | ICD-10-CM | POA: Diagnosis not present

## 2023-07-08 DIAGNOSIS — Z1211 Encounter for screening for malignant neoplasm of colon: Secondary | ICD-10-CM

## 2023-07-08 LAB — COMPREHENSIVE METABOLIC PANEL
ALT: 11 U/L (ref 0–53)
AST: 14 U/L (ref 0–37)
Albumin: 4.3 g/dL (ref 3.5–5.2)
Alkaline Phosphatase: 81 U/L (ref 39–117)
BUN: 10 mg/dL (ref 6–23)
CO2: 30 meq/L (ref 19–32)
Calcium: 9.5 mg/dL (ref 8.4–10.5)
Chloride: 102 meq/L (ref 96–112)
Creatinine, Ser: 1.11 mg/dL (ref 0.40–1.50)
GFR: 79.96 mL/min (ref >60.00)
Glucose, Bld: 100 mg/dL — ABNORMAL HIGH (ref 70–99)
Potassium: 4.6 meq/L (ref 3.5–5.1)
Sodium: 136 meq/L (ref 135–145)
Total Bilirubin: 1 mg/dL (ref 0.2–1.2)
Total Protein: 7.2 g/dL (ref 6.0–8.3)

## 2023-07-08 LAB — LIPID PANEL
Cholesterol: 169 mg/dL (ref 0–200)
HDL: 57.2 mg/dL (ref >39.00)
LDL Cholesterol: 100 mg/dL — ABNORMAL HIGH (ref 0–99)
NonHDL: 112.18
Total CHOL/HDL Ratio: 3
Triglycerides: 59 mg/dL (ref 0.0–149.0)
VLDL: 11.8 mg/dL (ref 0.0–40.0)

## 2023-07-08 LAB — CBC WITH DIFFERENTIAL/PLATELET
Basophils Absolute: 0 10*3/uL (ref 0.0–0.1)
Basophils Relative: 0.5 % (ref 0.0–3.0)
Eosinophils Absolute: 0 10*3/uL (ref 0.0–0.7)
Eosinophils Relative: 0.7 % (ref 0.0–5.0)
HCT: 43.1 % (ref 39.0–52.0)
Hemoglobin: 14 g/dL (ref 13.0–17.0)
Lymphocytes Relative: 29.3 % (ref 12.0–46.0)
Lymphs Abs: 1.5 10*3/uL (ref 0.7–4.0)
MCHC: 32.5 g/dL (ref 30.0–36.0)
MCV: 94.7 fL (ref 78.0–100.0)
Monocytes Absolute: 0.4 10*3/uL (ref 0.1–1.0)
Monocytes Relative: 8.3 % (ref 3.0–12.0)
Neutro Abs: 3.1 10*3/uL (ref 1.4–7.7)
Neutrophils Relative %: 61.2 % (ref 43.0–77.0)
Platelets: 260 10*3/uL (ref 150.0–400.0)
RBC: 4.55 Mil/uL (ref 4.22–5.81)
RDW: 13.3 % (ref 11.5–15.5)
WBC: 5.1 10*3/uL (ref 4.0–10.5)

## 2023-07-08 LAB — PSA: PSA: 0.9 ng/mL (ref 0.10–4.00)

## 2023-07-08 LAB — MICROALBUMIN / CREATININE URINE RATIO
Creatinine,U: 77 mg/dL
Microalb Creat Ratio: 0.9 mg/g (ref 0.0–30.0)
Microalb, Ur: <0.7 mg/dL (ref 0.0–1.9)

## 2023-07-08 LAB — HEMOGLOBIN A1C: Hgb A1c MFr Bld: 5.7 % (ref 4.6–6.5)

## 2023-07-08 NOTE — Assessment & Plan Note (Signed)
Well-controlled diabetes and dyslipidemia Chronic stable condition Blood work done today including lipid profile and hemoglobin A1c Diet and nutrition discussed Cardiac vascular risks associated with dyslipidemia and diabetes discussed

## 2023-07-08 NOTE — Assessment & Plan Note (Signed)
Stable and well-controlled Continues to see psychiatrist on a regular basis Continues Celexa 20 mg daily and trazodone 50 mg at bedtime as needed

## 2023-07-08 NOTE — Addendum Note (Signed)
Addended by: Jerrell Belfast on: 07/08/2023 01:28 PM   Modules accepted: Orders

## 2023-07-08 NOTE — Patient Instructions (Signed)
Health Maintenance, Male Adopting a healthy lifestyle and getting preventive care are important in promoting health and wellness. Ask your health care provider about: The right schedule for you to have regular tests and exams. Things you can do on your own to prevent diseases and keep yourself healthy. What should I know about diet, weight, and exercise? Eat a healthy diet  Eat a diet that includes plenty of vegetables, fruits, low-fat dairy products, and lean protein. Do not eat a lot of foods that are high in solid fats, added sugars, or sodium. Maintain a healthy weight Body mass index (BMI) is a measurement that can be used to identify possible weight problems. It estimates body fat based on height and weight. Your health care provider can help determine your BMI and help you achieve or maintain a healthy weight. Get regular exercise Get regular exercise. This is one of the most important things you can do for your health. Most adults should: Exercise for at least 150 minutes each week. The exercise should increase your heart rate and make you sweat (moderate-intensity exercise). Do strengthening exercises at least twice a week. This is in addition to the moderate-intensity exercise. Spend less time sitting. Even light physical activity can be beneficial. Watch cholesterol and blood lipids Have your blood tested for lipids and cholesterol at 46 years of age, then have this test every 5 years. You may need to have your cholesterol levels checked more often if: Your lipid or cholesterol levels are high. You are older than 46 years of age. You are at high risk for heart disease. What should I know about cancer screening? Many types of cancers can be detected early and may often be prevented. Depending on your health history and family history, you may need to have cancer screening at various ages. This may include screening for: Colorectal cancer. Prostate cancer. Skin cancer. Lung  cancer. What should I know about heart disease, diabetes, and high blood pressure? Blood pressure and heart disease High blood pressure causes heart disease and increases the risk of stroke. This is more likely to develop in people who have high blood pressure readings or are overweight. Talk with your health care provider about your target blood pressure readings. Have your blood pressure checked: Every 3-5 years if you are 18-39 years of age. Every year if you are 40 years old or older. If you are between the ages of 65 and 75 and are a current or former smoker, ask your health care provider if you should have a one-time screening for abdominal aortic aneurysm (AAA). Diabetes Have regular diabetes screenings. This checks your fasting blood sugar level. Have the screening done: Once every three years after age 45 if you are at a normal weight and have a low risk for diabetes. More often and at a younger age if you are overweight or have a high risk for diabetes. What should I know about preventing infection? Hepatitis B If you have a higher risk for hepatitis B, you should be screened for this virus. Talk with your health care provider to find out if you are at risk for hepatitis B infection. Hepatitis C Blood testing is recommended for: Everyone born from 1945 through 1965. Anyone with known risk factors for hepatitis C. Sexually transmitted infections (STIs) You should be screened each year for STIs, including gonorrhea and chlamydia, if: You are sexually active and are younger than 46 years of age. You are older than 46 years of age and your   health care provider tells you that you are at risk for this type of infection. Your sexual activity has changed since you were last screened, and you are at increased risk for chlamydia or gonorrhea. Ask your health care provider if you are at risk. Ask your health care provider about whether you are at high risk for HIV. Your health care provider  may recommend a prescription medicine to help prevent HIV infection. If you choose to take medicine to prevent HIV, you should first get tested for HIV. You should then be tested every 3 months for as long as you are taking the medicine. Follow these instructions at home: Alcohol use Do not drink alcohol if your health care provider tells you not to drink. If you drink alcohol: Limit how much you have to 0-2 drinks a day. Know how much alcohol is in your drink. In the [place] one drink equals one 12 oz bottle of beer (355 mL), one 5 oz glass of wine (148 mL), or one 1 oz glass of hard liquor (44 mL). Lifestyle Do not use any products that contain nicotine or tobacco. These products include cigarettes, chewing tobacco, and vaping devices, such as e-cigarettes. If you need help quitting, ask your health care provider. Do not use street drugs. Do not share needles. Ask your health care provider for help if you need support or information about quitting drugs. General instructions Schedule regular health, dental, and eye exams. Stay current with your vaccines. Tell your health care provider if: You often feel depressed. You have ever been abused or do not feel safe at home. Summary Adopting a healthy lifestyle and getting preventive care are important in promoting health and wellness. Follow your health care provider's instructions about healthy diet, exercising, and getting tested or screened for diseases. Follow your health care provider's instructions on monitoring your cholesterol and blood pressure. This information is not intended to replace advice given to you by your health care provider. Make sure you discuss any questions you have with your health care provider. Document Revised: 01/15/2021 Document Reviewed: 01/15/2021 Elsevier Patient Education  2024 [employer]  

## 2023-07-08 NOTE — Progress Notes (Signed)
Jason Decker 47 y.o.   Chief Complaint  Patient presents with   Annual Exam    No concerns     HISTORY OF PRESENT ILLNESS: This is a 47 y.o. male here for annual exam. History of depression and on Celexa 20 mg.  Working well.  Feels much better Since our last visit he was able to follow-up with behavioral health with good results. Has no complaints or medical concerns today.  HPI   Prior to Admission medications   Medication Sig Start Date End Date Taking? Authorizing Provider  blood glucose meter kit and supplies KIT Dispense based on patient and insurance preference. Use up to four times daily as directed. (FOR ICD-9 250.00, 250.01). 10/11/20  Yes Tinlee Navarrette, Eilleen Kempf, MD  citalopram (CELEXA) 20 MG tablet Take 1 tablet (20 mg total) by mouth daily. 04/30/23  Yes Park Pope, MD  traZODone (DESYREL) 50 MG tablet Take 1 tablet (50 mg total) by mouth at bedtime. 04/30/23 07/29/23 Yes Park Pope, MD  cyclobenzaprine (FLEXERIL) 10 MG tablet Take 1 tablet (10 mg total) by mouth 2 (two) times daily as needed for muscle spasms. Patient not taking: Reported on 10/11/2020 07/09/20   Moshe Cipro, FNP  diphenhydrAMINE (BENADRYL) 25 mg capsule Take 50 mg by mouth every 6 (six) hours as needed. Patient not taking: Reported on 12/26/2021    [provider]  EPINEPHrine (AUVI-Q) 0.3 mg/0.3 mL IJ SOAJ injection Inject 0.3 mLs (0.3 mg total) into the muscle as needed for anaphylaxis. Patient not taking: Reported on 10/17/2021 04/26/19   Ellamae Sia, DO  glipiZIDE (GLUCOTROL) 5 MG tablet Take 1 tablet (5 mg total) by mouth 2 (two) times daily before a meal. 06/01/19 09/13/21  Georgina Quint, MD  glucose blood test strip Use as instructed 10/11/20   Georgina Quint, MD  levocetirizine (XYZAL) 5 MG tablet Take 1 tablet (5 mg total) by mouth every evening. Patient not taking: Reported on 12/26/2021 04/23/19   Ellamae Sia, DO  metFORMIN (GLUCOPHAGE) 500 MG tablet Take 1 tablet (500 mg  total) by mouth 2 (two) times daily with a meal. Patient not taking: Reported on 12/26/2021 09/22/19   Georgina Quint, MD  methocarbamol (ROBAXIN) 500 MG tablet Take 1 tablet (500 mg total) by mouth 4 (four) times daily. Patient not taking: Reported on 09/13/2021 07/12/20   Janeece Agee, NP  nicotine (NICODERM CQ - DOSED IN MG/24 HOURS) 21 mg/24hr patch Place 1 patch (21 mg total) onto the skin daily. 02/13/23   [name], MD  rosuvastatin (CRESTOR) 20 MG tablet Take 1 tablet (20 mg total) by mouth daily. Patient not taking: Reported on 12/26/2021 10/11/20   Georgina Quint, MD    Allergies  Allergen Reactions   Shellfish Allergy Anaphylaxis    Patient Active Problem List   Diagnosis Date Noted   Panic disorder 02/13/2023   Insomnia 10/17/2021   Generalized anxiety disorder 09/13/2021   Dyslipidemia associated with type 2 diabetes mellitus (HCC) 06/01/2019   Dyslipidemia 06/01/2019    No past medical history on file.  No past surgical history on file.  Social History   Socioeconomic History   Marital status: Married    Spouse name: Not on file   Number of children: Not on file   Years of education: Not on file   Highest education level: Not on file  Occupational History   Not on file  Tobacco Use   Smoking status: Every Day    Types:  E-cigarettes   Smokeless tobacco: Never  Vaping Use   Vaping status: Never Used  Substance and Sexual Activity   Alcohol use: Yes   Drug use: Never   Sexual activity: Not on file  Other Topics Concern   Not on file  Social History Narrative   ** Merged History Encounter **       Social Determinants of Health   Financial Resource Strain: Not on file  Food Insecurity: Not on file  Transportation Needs: Not on file  Physical Activity: Not on file  Stress: Not on file  Social Connections: Not on file  Intimate Partner Violence: Not on file    No family history on file.   Review of Systems  Constitutional:  Negative.  Negative for chills and fever.  HENT: Negative.  Negative for congestion and sore throat.   Respiratory: Negative.  Negative for cough and shortness of breath.   Cardiovascular: Negative.  Negative for chest pain and palpitations.  Gastrointestinal:  Negative for abdominal pain, diarrhea, nausea and vomiting.  Genitourinary: Negative.  Negative for dysuria and hematuria.  Skin: Negative.  Negative for rash.  Neurological: Negative.  Negative for dizziness and headaches.  All other systems reviewed and are negative.   Today's Vitals   07/08/23 1304  BP: 126/88  Pulse: 81  Temp: 98.5 F (36.9 C)  TempSrc: Oral  SpO2: 99%  Weight: 193 lb 2 oz (87.6 kg)  Height: 5\' 11"  (1.803 m)   Body mass index is 26.94 kg/m.   Physical Exam Vitals reviewed.  Constitutional:      Appearance: Normal appearance.  HENT:     Head: Normocephalic.     Right Ear: Tympanic membrane, ear canal and external ear normal.     Left Ear: Tympanic membrane, ear canal and external ear normal.     Mouth/Throat:     Mouth: Mucous membranes are moist.     Pharynx: Oropharynx is clear.  Eyes:     Extraocular Movements: Extraocular movements intact.     Conjunctiva/sclera: Conjunctivae normal.     Pupils: Pupils are equal, round, and reactive to light.  Cardiovascular:     Rate and Rhythm: Normal rate and regular rhythm.     Pulses: Normal pulses.     Heart sounds: Normal heart sounds.  Pulmonary:     Effort: Pulmonary effort is normal.     Breath sounds: Normal breath sounds.  Abdominal:     Palpations: Abdomen is soft.     Tenderness: There is no abdominal tenderness.  Musculoskeletal:     Cervical back: No tenderness.  Lymphadenopathy:     Cervical: No cervical adenopathy.  Skin:    General: Skin is warm and dry.  Neurological:     General: No focal deficit present.     Mental Status: He is alert and oriented to person, place, and time.  Psychiatric:        Mood and Affect: Mood  normal.        Behavior: Behavior normal.      ASSESSMENT & PLAN: Problem List Items Addressed This Visit       Endocrine   Dyslipidemia associated with type 2 diabetes mellitus (HCC)    Well-controlled diabetes and dyslipidemia Chronic stable condition Blood work done today including lipid profile and hemoglobin A1c Diet and nutrition discussed Cardiac vascular risks associated with dyslipidemia and diabetes discussed      Relevant Orders   Hemoglobin A1c   Lipid panel   Urine Microalbumin  w/creat. ratio     Other   Generalized anxiety disorder    Stable and well-controlled Continues to see psychiatrist on a regular basis Continues Celexa 20 mg daily and trazodone 50 mg at bedtime as needed      Other Visit Diagnoses     Encounter for general adult medical examination with abnormal findings    -  Primary   Relevant Orders   CBC with Differential   Comprehensive metabolic panel   Hemoglobin A1c   Lipid panel   PSA(Must document that pt has been informed of limitations of PSA testing.)   Ambulatory referral to Gastroenterology   Screening for prostate cancer       Relevant Orders   PSA(Must document that pt has been informed of limitations of PSA testing.)   Colon cancer screening       Relevant Orders   Ambulatory referral to Gastroenterology   Screening for deficiency anemia       Relevant Orders   CBC with Differential   Screening for lipoid disorders       Screening for endocrine, metabolic and immunity disorder       Relevant Orders   Comprehensive metabolic panel   Hemoglobin A1c      Modifiable risk factors discussed with patient. Anticipatory guidance according to age provided. The following topics were also discussed: Social Determinants of Health Smoking.  Non-smoker Diet and nutrition Benefits of exercise Cancer screening and need for colon cancer screening Vaccinations reviewed and recommendations Cardiovascular risk assessment The  10-year ASCVD risk score (Arnett DK, et al., 2019) is: 11.6%   Values used to calculate the score:     Age: 75 years     Sex: Male     Is Non-Hispanic African American: Yes     Diabetic: Yes     Tobacco smoker: Yes     Systolic Blood Pressure: 126 mmHg     Is BP treated: No     HDL Cholesterol: 47 mg/dL     Total Cholesterol: 138 mg/dL Review of chronic medical conditions Review of all medications. Mental health including depression and anxiety Fall and accident prevention  Patient Instructions  Health Maintenance, Male Adopting a healthy lifestyle and getting preventive care are important in promoting health and wellness. Ask your health care provider about: The right schedule for you to have regular tests and exams. Things you can do on your own to prevent diseases and keep yourself healthy. What should I know about diet, weight, and exercise? Eat a healthy diet  Eat a diet that includes plenty of vegetables, fruits, low-fat dairy products, and lean protein. Do not eat a lot of foods that are high in solid fats, added sugars, or sodium. Maintain a healthy weight Body mass index (BMI) is a measurement that can be used to identify possible weight problems. It estimates body fat based on height and weight. Your health care provider can help determine your BMI and help you achieve or maintain a healthy weight. Get regular exercise Get regular exercise. This is one of the most important things you can do for your health. Most adults should: Exercise for at least 150 minutes each week. The exercise should increase your heart rate and make you sweat (moderate-intensity exercise). Do strengthening exercises at least twice a week. This is in addition to the moderate-intensity exercise. Spend less time sitting. Even light physical activity can be beneficial. Watch cholesterol and blood lipids Have your blood tested for lipids and cholesterol at  46 years of age, then have this test every 5  years. You may need to have your cholesterol levels checked more often if: Your lipid or cholesterol levels are high. You are older than 46 years of age. You are at high risk for heart disease. What should I know about cancer screening? Many types of cancers can be detected early and may often be prevented. Depending on your health history and family history, you may need to have cancer screening at various ages. This may include screening for: Colorectal cancer. Prostate cancer. Skin cancer. Lung cancer. What should I know about heart disease, diabetes, and high blood pressure? Blood pressure and heart disease High blood pressure causes heart disease and increases the risk of stroke. This is more likely to develop in people who have high blood pressure readings or are overweight. Talk with your health care provider about your target blood pressure readings. Have your blood pressure checked: Every 3-5 years if you are 1-27 years of age. Every year if you are 56 years old or older. If you are between the ages of 59 and 65 and are a current or former smoker, ask your health care provider if you should have a one-time screening for abdominal aortic aneurysm (AAA). Diabetes Have regular diabetes screenings. This checks your fasting blood sugar level. Have the screening done: Once every three years after age 34 if you are at a normal weight and have a low risk for diabetes. More often and at a younger age if you are overweight or have a high risk for diabetes. What should I know about preventing infection? Hepatitis B If you have a higher risk for hepatitis B, you should be screened for this virus. Talk with your health care provider to find out if you are at risk for hepatitis B infection. Hepatitis C Blood testing is recommended for: Everyone born from [birth year] through 1965. Anyone with known risk factors for hepatitis C. Sexually transmitted infections (STIs) You should be screened each  year for STIs, including gonorrhea and chlamydia, if: You are sexually active and are younger than 46 years of age. You are older than 46 years of age and your health care provider tells you that you are at risk for this type of infection. Your sexual activity has changed since you were last screened, and you are at increased risk for chlamydia or gonorrhea. Ask your health care provider if you are at risk. Ask your health care provider about whether you are at high risk for HIV. Your health care provider may recommend a prescription medicine to help prevent HIV infection. If you choose to take medicine to prevent HIV, you should first get tested for HIV. You should then be tested every 3 months for as long as you are taking the medicine. Follow these instructions at home: Alcohol use Do not drink alcohol if your health care provider tells you not to drink. If you drink alcohol: Limit how much you have to 0-2 drinks a day. Know how much alcohol is in your drink. In the [place] one drink equals one 12 oz bottle of beer (355 mL), one 5 oz glass of wine (148 mL), or one 1 oz glass of hard liquor (44 mL). Lifestyle Do not use any products that contain nicotine or tobacco. These products include cigarettes, chewing tobacco, and vaping devices, such as e-cigarettes. If you need help quitting, ask your health care provider. Do not use street drugs. Do not share needles. Ask your health  care provider for help if you need support or information about quitting drugs. General instructions Schedule regular health, dental, and eye exams. Stay current with your vaccines. Tell your health care provider if: You often feel depressed. You have ever been abused or do not feel safe at home. Summary Adopting a healthy lifestyle and getting preventive care are important in promoting health and wellness. Follow your health care provider's instructions about healthy diet, exercising, and getting tested or screened  for diseases. Follow your health care provider's instructions on monitoring your cholesterol and blood pressure. This information is not intended to replace advice given to you by your health care provider. Make sure you discuss any questions you have with your health care provider. Document Revised: 01/15/2021 Document Reviewed: 01/15/2021 Elsevier Patient Education  2024 Elsevier Inc.     Edwina Barth, MD [facility]

## 2023-08-01 NOTE — Progress Notes (Deleted)
BH MD Outpatient Progress Note  08/01/2023 2:57 PM Jason Decker  MRN:  [id number]  Assessment:  Jason Decker presents for follow-up evaluation in-person. Today, 08/01/23, patient reports continued stability with current citalopram dose and continued use of trazodone for sleep. He will follow up in 2 months and if continue to be stable will request that PCP continue prescribing current psychotropic regiment. He plans to see PCP to get lab draws before next appointment with me.   Identifying Information: Jason Decker is a 47 y.o. male with a history of GAD w/ panic disorder and dyslipidemia who is an established patient with [facility] for medication management.   Plan:  # GAD w/ panic attacks Past medication trials:  Status of problem: remission Interventions: -- continue celexa 20 mg daily   Return to care in 2 months  Patient was given contact information for behavioral health clinic and was instructed to call 911 for emergencies.    Patient and plan of care will be discussed with the Attending MD, Dr. Mercy Riding, who agrees with the above statement and plan.   Subjective:  Chief Complaint: Medication Management   Interval History:  Patient reports GAD remains in remission. He is doing well at work and has not had any more panic attacks as he did 19 months ago. He reports being fearful of becoming as anxious as he did 19 months ago which is a strong motivator for him to continue taking celexa. He takes trazodone every night as he reports it helps him sleep at the right time. He denies SI/HI/AVH. He reports no significant changes since a few months prior when he saw Dr. Morrie Sheldon. All questions addressed. He denies significant somatic complaints related to being on celexa. He plans to schedule an annual exam with PCP prior to next visit with me.   Visit Diagnosis:  No diagnosis found.   Past Psychiatric History:  INPT: Denies OUTPT: denies Therapy: started  this year and is current Previous meds: Celexa and Trazodone No hx of SA   Last visit 06/2022-increase patient's Celexa to 20 mg due to continued anxiety symptoms 10/2022- increased celexa to 30mg  still having some ruminative thoughts but having improvement. MDD and panic d/o were resolved.     Past Medical History: none Family Psychiatric History:  Mom: Etoh use disorder and Anxiety M Aunt: Anxiety Cousin: SA Uncles: SUD  Family History:  No family history on file.  Social History:  Academic/Vocational: lawnmowing business Social History   Socioeconomic History   Marital status: Married    Spouse name: Not on file   Number of children: Not on file   Years of education: Not on file   Highest education level: Not on file  Occupational History   Not on file  Tobacco Use   Smoking status: Every Day    Types: E-cigarettes   Smokeless tobacco: Never  Vaping Use   Vaping status: Never Used  Substance and Sexual Activity   Alcohol use: Yes   Drug use: Never   Sexual activity: Not on file  Other Topics Concern   Not on file  Social History Narrative   ** Merged History Encounter **       Social Determinants of Health   Financial Resource Strain: Not on file  Food Insecurity: Not on file  Transportation Needs: Not on file  Physical Activity: Not on file  Stress: Not on file  Social Connections: Not on file    Allergies:  Allergies  Allergen Reactions  Shellfish Allergy Anaphylaxis    Current Medications: Current Outpatient Medications  Medication Sig Dispense Refill   blood glucose meter kit and supplies KIT Dispense based on patient and insurance preference. Use up to four times daily as directed. (FOR ICD-9 250.00, 250.01). 1 each 0   citalopram (CELEXA) 20 MG tablet Take 1 tablet (20 mg total) by mouth daily. 30 tablet 2   diphenhydrAMINE (BENADRYL) 25 mg capsule Take 50 mg by mouth every 6 (six) hours as needed. (Patient not taking: Reported on  12/26/2021)     EPINEPHrine (AUVI-Q) 0.3 mg/0.3 mL IJ SOAJ injection Inject 0.3 mLs (0.3 mg total) into the muscle as needed for anaphylaxis. (Patient not taking: Reported on 10/17/2021) 2 each 2   glipiZIDE (GLUCOTROL) 5 MG tablet Take 1 tablet (5 mg total) by mouth 2 (two) times daily before a meal. 180 tablet 1   glucose blood test strip Use as instructed 100 each 12   nicotine (NICODERM CQ - DOSED IN MG/24 HOURS) 21 mg/24hr patch Place 1 patch (21 mg total) onto the skin daily. 28 patch 2   traZODone (DESYREL) 50 MG tablet Take 1 tablet (50 mg total) by mouth at bedtime. 30 tablet 2   No current facility-administered medications for this visit.    ROS: Review of Systems   Objective:  Psychiatric Specialty Exam: There were no vitals taken for this visit.There is no height or weight on file to calculate BMI.  General Appearance: Fairly Groomed  Eye Contact: Good  Speech:  Clear and Coherent and Normal Rate  Volume:  Normal  Mood:  Euthymic  Affect:  Appropriate and Congruent  Thought Content: Logical   Suicidal Thoughts:  No  Homicidal Thoughts:  No  Thought Process:  Coherent, Goal Directed, and Linear  Orientation:  Full (Time, Place, and Person)    Memory: Remote;   Fair  Judgment:  Fair  Insight:  Fair  Concentration:  Concentration: Fair and Attention Span: Fair  Recall: not formally assessed   Fund of Knowledge: Fair  Language: Fair  Psychomotor Activity:  Normal  Akathisia:  No  AIMS (if indicated): not done  Assets:  Communication Skills Desire for Improvement Financial Resources/Insurance Housing Intimacy Leisure Time Physical Health Resilience Social Support Talents/Skills Transportation Vocational/Educational  ADL's:  Intact  Cognition: WNL  Sleep:  Good   PE: General: well-appearing; no acute distress  Pulm: no increased work of breathing on room air  Strength & Muscle Tone: within normal limits Neuro: no focal neurological deficits observed   Gait & Station: normal  Metabolic Disorder Labs: Lab Results  Component Value Date   HGBA1C 5.7 07/08/2023   No results found for: "PROLACTIN" Lab Results  Component Value Date   CHOL 169 07/08/2023   TRIG 59.0 07/08/2023   HDL 57.20 07/08/2023   CHOLHDL 3 07/08/2023   VLDL 11.8 07/08/2023   LDLCALC 100 (H) 07/08/2023   LDLCALC 79 12/26/2021   Lab Results  Component Value Date   TSH 0.65 09/13/2021   TSH 0.966 05/31/2019    Therapeutic Level Labs: No results found for: "LITHIUM" No results found for: "VALPROATE" No results found for: "CBMZ"  Screenings: PHQ2-9    Flowsheet Row Office Visit from 07/08/2023 in [facility] [place] Office Visit from 12/26/2021 in [facility] [place] Office Visit from 09/13/2021 in [facility] [place] Office Visit from 10/11/2020 in Primary Care at [facility] Visit from 07/12/2020 in Primary  Care at [facility] Total Score 0 0 2 0 0  PHQ-9 Total Score 0 -- -- -- --      Flowsheet Row ED from 08/27/2021 in [facility] at [facility]  C-SSRS RISK CATEGORY No Risk       Collaboration of Care: Collaboration of Care:  Patient/Guardian was advised Release of Information must be obtained prior to any record release in order to collaborate their care with an outside provider. Patient/Guardian was advised if they have not already done so to contact the registration department to sign all necessary forms in order for [place] to release information regarding their care.   Consent: Patient/Guardian gives verbal consent for treatment and assignment of benefits for services provided during this visit. Patient/Guardian expressed understanding and agreed to proceed.   Park Pope, MD 08/01/2023, 2:57 PM

## 2023-08-04 ENCOUNTER — Ambulatory Visit (HOSPITAL_COMMUNITY): Payer: 59 | Admitting: Student

## 2023-08-04 ENCOUNTER — Encounter (HOSPITAL_COMMUNITY): Payer: Self-pay

## 2023-08-17 NOTE — Progress Notes (Unsigned)
BH MD Outpatient Progress Note  08/18/2023 1:16 PM Jason Decker  MRN:  [id number]  Assessment:  Jason Decker presents for follow-up evaluation in-person. On initial visit, patient presented due to recurrent panic attacks and had been diagnosed with GAD w/ panic disorder. He has been relatively stable on citalopram 20 mg and trazodone 50 mg.  Today, 08/18/23, patient reports continued stability in regards to his GAD with current citalopram dose and continued use of trazodone for sleep. His panic attacks have been managed well but he does still have occasional episodes of persistent anxiety lasting up to one week. We will continue to monitor his symptoms. He is interested in eventually tapering off his psychotropics when appropriate which will likely be following 6 months of stability.    Identifying Information: Jason Decker is a 47 y.o. male with a history of GAD w/ panic disorder and dyslipidemia who is an established patient with [facility] for medication management.   Plan:  # GAD w/ panic attacks Past medication trials:  Status of problem: remission Interventions: -- continue celexa 20 mg daily   Return to care in 2 months  Patient was given contact information for behavioral health clinic and was instructed to call 911 for emergencies.    Patient and plan of care will be discussed with the Attending MD, Dr. Mercy Riding, who agrees with the above statement and plan.   Subjective:  Chief Complaint: Medication Management   Interval History:  Patient reports panic attacks remains in remission. He is doing well at work and has not had any more panic attacks but does state still having episodes of anxiety where he will ruminate about some stressor for ~1 week. Last time he experienced this anxiety was ~1 week following Thanksgiving. He takes trazodone every night as he reports it helps him sleep at the right time and prevents him from waking up in the middle of the  night. He denies SI/HI/AVH. He denies significant somatic complaints related to being on celexa.  Visit Diagnosis:    ICD-10-CM   1. Generalized anxiety disorder  F41.1 citalopram (CELEXA) 20 MG tablet    2. Insomnia, unspecified type  G47.00 traZODone (DESYREL) 50 MG tablet       Past Psychiatric History:  INPT: Denies OUTPT: denies Therapy: started this year and is current Previous meds: Celexa and Trazodone No hx of SA   Last visit 06/2022-increase patient's Celexa to 20 mg due to continued anxiety symptoms 10/2022- increased celexa to 30mg  still having some ruminative thoughts but having improvement. MDD and panic d/o were resolved.     Past Medical History: none Family Psychiatric History:  Mom: Etoh use disorder and Anxiety M Aunt: Anxiety Cousin: SA Uncles: SUD  Family History:  No family history on file.  Social History:  Academic/Vocational: lawnmowing business Social History   Socioeconomic History   Marital status: Married    Spouse name: Not on file   Number of children: Not on file   Years of education: Not on file   Highest education level: Not on file  Occupational History   Not on file  Tobacco Use   Smoking status: Every Day    Types: E-cigarettes   Smokeless tobacco: Never  Vaping Use   Vaping status: Never Used  Substance and Sexual Activity   Alcohol use: Yes   Drug use: Never   Sexual activity: Not on file  Other Topics Concern   Not on file  Social History Narrative   **  Merged History Encounter **       Social Determinants of Health   Financial Resource Strain: Not on file  Food Insecurity: Not on file  Transportation Needs: Not on file  Physical Activity: Not on file  Stress: Not on file  Social Connections: Not on file    Allergies:  Allergies  Allergen Reactions   Shellfish Allergy Anaphylaxis    Current Medications: Current Outpatient Medications  Medication Sig Dispense Refill   blood glucose meter kit and  supplies KIT Dispense based on patient and insurance preference. Use up to four times daily as directed. (FOR ICD-9 250.00, 250.01). 1 each 0   citalopram (CELEXA) 20 MG tablet Take 1 tablet (20 mg total) by mouth daily. 90 tablet 0   diphenhydrAMINE (BENADRYL) 25 mg capsule Take 50 mg by mouth every 6 (six) hours as needed. (Patient not taking: Reported on 12/26/2021)     EPINEPHrine (AUVI-Q) 0.3 mg/0.3 mL IJ SOAJ injection Inject 0.3 mLs (0.3 mg total) into the muscle as needed for anaphylaxis. (Patient not taking: Reported on 10/17/2021) 2 each 2   glipiZIDE (GLUCOTROL) 5 MG tablet Take 1 tablet (5 mg total) by mouth 2 (two) times daily before a meal. 180 tablet 1   glucose blood test strip Use as instructed 100 each 12   nicotine (NICODERM CQ - DOSED IN MG/24 HOURS) 21 mg/24hr patch Place 1 patch (21 mg total) onto the skin daily. 28 patch 2   traZODone (DESYREL) 50 MG tablet Take 1 tablet (50 mg total) by mouth at bedtime. 90 tablet 0   No current facility-administered medications for this visit.    ROS: Review of Systems   Objective:  Psychiatric Specialty Exam: There were no vitals taken for this visit.There is no height or weight on file to calculate BMI.  General Appearance: Fairly Groomed  Eye Contact: Good  Speech:  Clear and Coherent and Normal Rate  Volume:  Normal  Mood:  Euthymic  Affect:  Appropriate and Congruent  Thought Content: Logical   Suicidal Thoughts:  No  Homicidal Thoughts:  No  Thought Process:  Coherent, Goal Directed, and Linear  Orientation:  Full (Time, Place, and Person)    Memory: Remote;   Fair  Judgment:  Fair  Insight:  Fair  Concentration:  Concentration: Fair and Attention Span: Fair  Recall: not formally assessed   Fund of Knowledge: Fair  Language: Fair  Psychomotor Activity:  Normal  Akathisia:  No  AIMS (if indicated): not done  Assets:  Communication Skills Desire for Improvement Financial  Resources/Insurance Housing Intimacy Leisure Time Physical Health Resilience Social Support Talents/Skills Transportation Vocational/Educational  ADL's:  Intact  Cognition: WNL  Sleep:  Good   PE: General: well-appearing; no acute distress  Pulm: no increased work of breathing on room air  Strength & Muscle Tone: within normal limits Neuro: no focal neurological deficits observed  Gait & Station: normal  Metabolic Disorder Labs: Lab Results  Component Value Date   HGBA1C 5.7 07/08/2023   No results found for: "PROLACTIN" Lab Results  Component Value Date   CHOL 169 07/08/2023   TRIG 59.0 07/08/2023   HDL 57.20 07/08/2023   CHOLHDL 3 07/08/2023   VLDL 11.8 07/08/2023   LDLCALC 100 (H) 07/08/2023   LDLCALC 79 12/26/2021   Lab Results  Component Value Date   TSH 0.65 09/13/2021   TSH 0.966 05/31/2019    Therapeutic Level Labs: No results found for: "LITHIUM" No results found for: "VALPROATE" No results  found for: "CBMZ"  Screenings: PHQ2-9    Flowsheet Row Office Visit from 07/08/2023 in [facility] Visit from 12/26/2021 in [facility] Office Visit from 09/13/2021 in [facility] [place] Office Visit from 10/11/2020 in Primary Care at [facility] Visit from 07/12/2020 in [facility] at [facility] Total Score 0 0 2 0 0  PHQ-9 Total Score 0 -- -- -- --      Flowsheet Row ED from 08/27/2021 in [facility] Emergency Department at [facility]  C-SSRS RISK CATEGORY No Risk       Collaboration of Care: Collaboration of Care:  Patient/Guardian was advised Release of Information must be obtained prior to any record release in order to collaborate their care with an outside provider. Patient/Guardian was advised if they have not already done so to contact the registration department to sign all necessary forms in order for [place] to release information  regarding their care.   Consent: Patient/Guardian gives verbal consent for treatment and assignment of benefits for services provided during this visit. Patient/Guardian expressed understanding and agreed to proceed.   Park Pope, MD 08/18/2023, 1:16 PM

## 2023-08-18 ENCOUNTER — Ambulatory Visit (HOSPITAL_BASED_OUTPATIENT_CLINIC_OR_DEPARTMENT_OTHER): Payer: 59 | Admitting: Student

## 2023-08-18 ENCOUNTER — Encounter: Payer: Self-pay | Admitting: Internal Medicine

## 2023-08-18 ENCOUNTER — Encounter (HOSPITAL_COMMUNITY): Payer: Self-pay | Admitting: Student

## 2023-08-18 DIAGNOSIS — F411 Generalized anxiety disorder: Secondary | ICD-10-CM | POA: Diagnosis not present

## 2023-08-18 DIAGNOSIS — G4700 Insomnia, unspecified: Secondary | ICD-10-CM

## 2023-08-18 MED ORDER — CITALOPRAM HYDROBROMIDE 20 MG PO TABS
20.0000 mg | ORAL_TABLET | Freq: Every day | ORAL | 0 refills | Status: DC
Start: 2023-08-18 — End: 2023-11-11

## 2023-08-18 MED ORDER — TRAZODONE HCL 50 MG PO TABS
50.0000 mg | ORAL_TABLET | Freq: Every day | ORAL | 0 refills | Status: DC
Start: 2023-08-18 — End: 2023-11-17

## 2023-08-19 NOTE — Addendum Note (Signed)
Addended by: Everlena Cooper on: 08/19/2023 09:42 AM   Modules accepted: Level of Service

## 2023-09-18 ENCOUNTER — Ambulatory Visit (AMBULATORY_SURGERY_CENTER): Payer: 59

## 2023-09-18 VITALS — Ht 71.0 in | Wt 190.0 lb

## 2023-09-18 DIAGNOSIS — Z1211 Encounter for screening for malignant neoplasm of colon: Secondary | ICD-10-CM

## 2023-09-18 MED ORDER — NA SULFATE-K SULFATE-MG SULF 17.5-3.13-1.6 GM/177ML PO SOLN: 1.0000 | Freq: Once | ORAL | 0 refills | Status: AC

## 2023-09-18 NOTE — Progress Notes (Signed)

## 2023-10-01 ENCOUNTER — Encounter: Payer: Self-pay | Admitting: Internal Medicine

## 2023-10-02 ENCOUNTER — Encounter: Payer: Self-pay | Admitting: Internal Medicine

## 2023-10-02 NOTE — Progress Notes (Signed)
[facility] History and Physical   Primary Care Physician:  Georgina Quint, MD   Reason for Procedure:  Colon cancer screening  Plan:    Colonoscopy     HPI: Jason Decker is a 48 y.o. male presenting for a screening colonoscopy today.   Past Medical History:  Diagnosis Date   Dyslipidemia associated with type 2 diabetes mellitus (HCC) 06/01/2019   Generalized anxiety disorder 09/13/2021   Panic disorder 02/13/2023    History reviewed. No pertinent surgical history.  Prior to Admission medications   Medication Sig Start Date End Date Taking? Authorizing Provider  blood glucose meter kit and supplies KIT Dispense based on patient and insurance preference. Use up to four times daily as directed. (FOR ICD-9 250.00, 250.01). 10/11/20   Georgina Quint, MD  citalopram (CELEXA) 20 MG tablet Take 1 tablet (20 mg total) by mouth daily. 08/18/23   Park Pope, MD  diphenhydrAMINE (BENADRYL) 25 mg capsule Take 50 mg by mouth every 6 (six) hours as needed. Patient not taking: Reported on 09/18/2023    [provider]  EPINEPHrine (AUVI-Q) 0.3 mg/0.3 mL IJ SOAJ injection Inject 0.3 mLs (0.3 mg total) into the muscle as needed for anaphylaxis. Patient not taking: Reported on 10/17/2021 04/26/19   Ellamae Sia, DO  glipiZIDE (GLUCOTROL) 5 MG tablet Take 1 tablet (5 mg total) by mouth 2 (two) times daily before a meal. Patient not taking: Reported on 09/18/2023 06/01/19 09/13/21  Georgina Quint, MD  glucose blood test strip Use as instructed 10/11/20   Georgina Quint, MD  nicotine (NICODERM CQ - DOSED IN MG/24 HOURS) 21 mg/24hr patch Place 1 patch (21 mg total) onto the skin daily. Patient not taking: Reported on 09/18/2023 02/13/23   [name], MD  traZODone (DESYREL) 50 MG tablet Take 1 tablet (50 mg total) by mouth at bedtime. 08/18/23 11/16/23  Park Pope, MD    Current Outpatient Medications  Medication Sig Dispense Refill   citalopram (CELEXA) 20 MG  tablet Take 1 tablet (20 mg total) by mouth daily. 90 tablet 0   traZODone (DESYREL) 50 MG tablet Take 1 tablet (50 mg total) by mouth at bedtime. 90 tablet 0   blood glucose meter kit and supplies KIT Dispense based on patient and insurance preference. Use up to four times daily as directed. (FOR ICD-9 250.00, 250.01). (Patient not taking: Reported on 10/03/2023) 1 each 0   diphenhydrAMINE (BENADRYL) 25 mg capsule Take 50 mg by mouth every 6 (six) hours as needed. (Patient not taking: Reported on 12/26/2021)     EPINEPHrine (AUVI-Q) 0.3 mg/0.3 mL IJ SOAJ injection Inject 0.3 mLs (0.3 mg total) into the muscle as needed for anaphylaxis. (Patient not taking: Reported on 10/17/2021) 2 each 2   glipiZIDE (GLUCOTROL) 5 MG tablet Take 1 tablet (5 mg total) by mouth 2 (two) times daily before a meal. (Patient not taking: Reported on 09/18/2023) 180 tablet 1   glucose blood test strip Use as instructed (Patient not taking: Reported on 10/03/2023) 100 each 12   nicotine (NICODERM CQ - DOSED IN MG/24 HOURS) 21 mg/24hr patch Place 1 patch (21 mg total) onto the skin daily. (Patient not taking: Reported on 10/03/2023) 28 patch 2   Current Facility-Administered Medications  Medication Dose Route Frequency Provider Last Rate Last Admin   0.9 %  sodium chloride infusion  500 mL Intravenous Once Iva Boop, MD        Allergies as of 10/03/2023 - Review  Complete 10/03/2023  Allergen Reaction Noted   Shellfish allergy Anaphylaxis 04/23/2019    Family History  Problem Relation Age of Onset   Colon cancer Neg Hx    Esophageal cancer Neg Hx    Rectal cancer Neg Hx    Stomach cancer Neg Hx     Social History   Socioeconomic History   Marital status: Married    Spouse name: Not on file   Number of children: Not on file   Years of education: Not on file   Highest education level: Not on file  Occupational History   Not on file  Tobacco Use   Smoking status: Former    Types: E-cigarettes   Smokeless  tobacco: Never  Vaping Use   Vaping status: Never Used  Substance and Sexual Activity   Alcohol use: Not Currently   Drug use: Never   Sexual activity: Not on file  Other Topics Concern   Not on file  Social History Narrative   ** Merged History Encounter **       Social Drivers of Corporate investment banker Strain: Not on file  Food Insecurity: Not on file  Transportation Needs: Not on file  Physical Activity: Not on file  Stress: Not on file  Social Connections: Not on file  Intimate Partner Violence: Not on file    Review of Systems:  All other review of systems negative except as mentioned in the HPI.  Physical Exam: Vital signs BP 127/80   Pulse 84   Temp 98.4 F (36.9 C) (Temporal)   Resp 12   Ht 5\' 11"  (1.803 m)   Wt 190 lb (86.2 kg)   SpO2 100%   BMI 26.50 kg/m   General:   Alert,  Well-developed, well-nourished, pleasant and cooperative in NAD Lungs:  Clear throughout to auscultation.   Heart:  Regular rate and rhythm; no murmurs, clicks, rubs,  or gallops. Abdomen:  Soft, nontender and nondistended. Normal bowel sounds.   Neuro/Psych:  Alert and cooperative. Normal mood and affect. A and O x 3   @Selin Eisler  Sena Slate, MD, [facility] [phone] (pager) 10/03/2023 1:42 PM@

## 2023-10-03 ENCOUNTER — Encounter: Payer: Self-pay | Admitting: Internal Medicine

## 2023-10-03 ENCOUNTER — Ambulatory Visit (AMBULATORY_SURGERY_CENTER): Payer: 59 | Admitting: Internal Medicine

## 2023-10-03 VITALS — BP 103/70 | HR 62 | Temp 98.4°F | Resp 23 | Ht 71.0 in | Wt 190.0 lb

## 2023-10-03 DIAGNOSIS — Z1211 Encounter for screening for malignant neoplasm of colon: Secondary | ICD-10-CM

## 2023-10-03 MED ORDER — SODIUM CHLORIDE 0.9 % IV SOLN: 500.0000 mL | Freq: Once | INTRAVENOUS | Status: DC

## 2023-10-03 NOTE — Op Note (Signed)
[facility] Patient Name: Jason Decker Procedure Date: 10/03/2023 1:37 PM MRN: [id number] Endoscopist: Iva Boop , MD, [id number] Age: 48 Referring MD:  Date of Birth: [birth year] Gender: Male Account #: [id number] Procedure:                Colonoscopy Indications:              Screening for colorectal malignant neoplasm, This                            is the patient's first colonoscopy Medicines:                Monitored Anesthesia Care Procedure:                Pre-Anesthesia Assessment:                           - Prior to the procedure, a History and Physical                            was performed, and patient medications and                            allergies were reviewed. The patient's tolerance of                            previous anesthesia was also reviewed. The risks                            and benefits of the procedure and the sedation                            options and risks were discussed with the patient.                            All questions were answered, and informed consent                            was obtained. Prior Anticoagulants: The patient has                            taken no anticoagulant or antiplatelet agents. ASA                            Grade Assessment: II - A patient with mild systemic                            disease. After reviewing the risks and benefits,                            the patient was deemed in satisfactory condition to                            undergo the procedure.  After obtaining informed consent, the colonoscope                            was passed under direct vision. Throughout the                            procedure, the patient's blood pressure, pulse, and                            oxygen saturations were monitored continuously. The                            Olympus CF-HQ190L ([id number]) Colonoscope was                            introduced through the anus  and advanced to the the                            cecum, identified by appendiceal orifice and                            ileocecal valve. The colonoscopy was performed                            without difficulty. The patient tolerated the                            procedure well. The quality of the bowel                            preparation was excellent. The ileocecal valve,                            appendiceal orifice, and rectum were photographed.                            The bowel preparation used was SUPREP via split                            dose instruction. Scope In: 1:44:22 PM Scope Out: 1:54:39 PM Scope Withdrawal Time: 0 hours 8 minutes 1 second  Total Procedure Duration: 0 hours 10 minutes 17 seconds  Findings:                 The perianal and digital rectal examinations were                            normal.                           The entire examined colon appeared normal on direct                            and retroflexion views. Complications:            No immediate complications. Estimated Blood  Loss:     Estimated blood loss: none. Impression:               - The entire examined colon is normal on direct and                            retroflexion views.                           - No specimens collected. Recommendation:           - Patient has a contact number available for                            emergencies. The signs and symptoms of potential                            delayed complications were discussed with the                            patient. Return to normal activities tomorrow.                            Written discharge instructions were provided to the                            patient.                           - Resume previous diet.                           - Continue present medications.                           - Repeat colonoscopy in 10 years for screening                            purposes. Iva Boop, MD 10/03/2023  1:59:25 PM This report has been signed electronically.

## 2023-10-03 NOTE — Progress Notes (Signed)
Drowsy, VSS, resps reg and even. Report to RN

## 2023-10-03 NOTE — Progress Notes (Signed)
Pt's states no medical or surgical changes since previsit or office visit.

## 2023-10-03 NOTE — Patient Instructions (Addendum)
Normal colonoscopy - no polyps or colon cancer seen!  Next routine colonoscopy or other screening test in 10 years - 2035.  I appreciate the opportunity to care for you. Iva Boop, MD, FACG  YOU HAD AN ENDOSCOPIC PROCEDURE TODAY AT THE [facility]:   Refer to the procedure report that was given to you for any specific questions about what was found during the examination.  If the procedure report does not answer your questions, please call your gastroenterologist to clarify.  If you requested that your care partner not be given the details of your procedure findings, then the procedure report has been included in a sealed envelope for you to review at your convenience later.  YOU SHOULD EXPECT: Some feelings of bloating in the abdomen. Passage of more gas than usual.  Walking can help get rid of the air that was put into your GI tract during the procedure and reduce the bloating. If you had a lower endoscopy (such as a colonoscopy or flexible sigmoidoscopy) you may notice spotting of blood in your stool or on the toilet paper. If you underwent a bowel prep for your procedure, you may not have a normal bowel movement for a few days.  Please Note:  You might notice some irritation and congestion in your nose or some drainage.  This is from the oxygen used during your procedure.  There is no need for concern and it should clear up in a day or so.  SYMPTOMS TO REPORT IMMEDIATELY:  Following lower endoscopy (colonoscopy or flexible sigmoidoscopy):  Excessive amounts of blood in the stool  Significant tenderness or worsening of abdominal pains  Swelling of the abdomen that is new, acute  Fever of 100F or higher   For urgent or emergent issues, a gastroenterologist can be reached at any hour by calling [phone]. Do not use MyChart messaging for urgent concerns.    DIET:  We do recommend a small meal at first, but then you may proceed to your regular diet.  Drink plenty of  fluids but you should avoid alcoholic beverages for 24 hours.  MEDICATIONS: Continue present medications.  FOLLOW UP: Repeat colonoscopy in 10 years for screening purposes.  Thank you for allowing [place] to provide for your healthcare needs today.  ACTIVITY:  You should plan to take it easy for the rest of today and you should NOT DRIVE or use heavy machinery until tomorrow (because of the sedation medicines used during the test).    FOLLOW UP: Our staff will call the number listed on your records the next business day following your procedure.  We will call around 7:15- 8:00 am to check on you and address any questions or concerns that you may have regarding the information given to you following your procedure. If we do not reach you, we will leave a message.     If any biopsies were taken you will be contacted by phone or by letter within the next 1-3 weeks.  Please call us at [phone] if you have not heard about the biopsies in 3 weeks.    SIGNATURES/CONFIDENTIALITY: You and/or your care partner have signed paperwork which will be entered into your electronic medical record.  These signatures attest to the fact that that the information above on your After Visit Summary has been reviewed and is understood.  Full responsibility of the confidentiality of this discharge information lies with you and/or your care-partner.

## 2023-10-06 ENCOUNTER — Telehealth: Payer: Self-pay | Admitting: *Deleted

## 2023-10-06 NOTE — Telephone Encounter (Signed)
  Follow up Call-     10/03/2023   12:58 PM  Call back number  Post procedure Call Back phone  # [phone]  Permission to leave phone message Yes     Patient questions:  Do you have a fever, pain , or abdominal swelling? No. Pain Score  0 *  Have you tolerated food without any problems? Yes.    Have you been able to return to your normal activities? Yes.    Do you have any questions about your discharge instructions: Diet   No. Medications  No. Follow up visit  No.  Do you have questions or concerns about your Care? No.  Actions: * If pain score is 4 or above: No action needed, pain <4.

## 2023-11-11 ENCOUNTER — Telehealth (HOSPITAL_COMMUNITY): Payer: Self-pay

## 2023-11-11 DIAGNOSIS — F411 Generalized anxiety disorder: Secondary | ICD-10-CM

## 2023-11-11 MED ORDER — CITALOPRAM HYDROBROMIDE 20 MG PO TABS: 20.0000 mg | ORAL_TABLET | Freq: Every day | ORAL | 0 refills | Status: DC

## 2023-11-11 NOTE — Addendum Note (Signed)
 Addended by: Park Pope B on: 11/11/2023 03:17 PM   Modules accepted: Orders

## 2023-11-11 NOTE — Telephone Encounter (Signed)
 Hello,    I have this Pt here who is requesting for a refill of citalopram (CELEXA) 20 MG tablet. I Was  not sure which provider to add as his papers from [facility] had two listed.    JNL

## 2023-11-11 NOTE — Telephone Encounter (Signed)
 Celexa Refill sent to preferred pharmacy.

## 2023-11-17 ENCOUNTER — Ambulatory Visit (HOSPITAL_BASED_OUTPATIENT_CLINIC_OR_DEPARTMENT_OTHER): Payer: 59 | Admitting: Student

## 2023-11-17 DIAGNOSIS — F411 Generalized anxiety disorder: Secondary | ICD-10-CM

## 2023-11-17 DIAGNOSIS — G4700 Insomnia, unspecified: Secondary | ICD-10-CM | POA: Diagnosis not present

## 2023-11-17 MED ORDER — CITALOPRAM HYDROBROMIDE 20 MG PO TABS
20.0000 mg | ORAL_TABLET | Freq: Every day | ORAL | 0 refills | Status: DC
Start: 2023-11-17 — End: 2024-03-22

## 2023-11-17 MED ORDER — HYDROXYZINE HCL 25 MG PO TABS: 25.0000 mg | ORAL_TABLET | Freq: Three times a day (TID) | ORAL | 0 refills | Status: DC | PRN

## 2023-11-17 MED ORDER — TRAZODONE HCL 50 MG PO TABS
50.0000 mg | ORAL_TABLET | Freq: Every day | ORAL | 0 refills | Status: DC
Start: 2023-11-17 — End: 2024-03-22

## 2023-11-17 NOTE — Progress Notes (Unsigned)
 BH MD Outpatient Progress Note  11/17/2023 8:39 AM Jason Decker  MRN:  [id number]  Assessment:  Ulas Zuercher presents for follow-up evaluation in-person. On initial visit, patient presented due to recurrent panic attacks and had been diagnosed with GAD w/ panic disorder. He has been relatively stable on citalopram 20 mg and trazodone 50 mg.  Today, 11/18/23, patient reports episodic anxiety secondary to psychosocial stressors but overall continued stability in regards to his GAD with current citalopram dose and continued use of trazodone for sleep.  He was amenable to restarting hydroxyzine 25 mg 3 times daily as needed for anxiety as he had been on before.  We will continue to monitor his symptoms. He remains interested in eventually tapering off his psychotropics but will keep to the same for now.  He also was interested in finding a new therapist as he wants to see if a t different therapist would have different recommendations for him.  Patient to follow-up in 2 months.    Identifying Information: Jason Decker is a 48 y.o. male with a history of GAD w/ panic disorder and dyslipidemia who is an established patient with [facility] for medication management.   Plan:  # Generalized Anxiety Disorder # Panic Disorder Past medication trials:  Status of problem: remission Interventions: -- continue celexa 20 mg daily --Continue trazodone 50 mg nightly as needed --Restart hydroxyzine 25 mg 3 times daily as needed for anxiety -- Referral to therapist   Return to care in 2 months  Patient was given contact information for behavioral health clinic and was instructed to call 911 for emergencies.    Patient and plan of care will be discussed with the Attending MD, Dr. Mercy Riding, who agrees with the above statement and plan.   Subjective:  Chief Complaint: Medication Management   Interval History:  Patient reports panic attacks remains in remission.  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  He wanted to explore restarting hydroxyzine to aid with his anxiety as he notes feeling that he ruminates quite often.  He denies SI/HI/AVH.  He also reports wanting to establish with a new therapist as he feels the benefits he has received from former therapist has plateaued.  Visit Diagnosis:    ICD-10-CM   1. Generalized anxiety disorder  F41.1 hydrOXYzine (ATARAX) 25 MG tablet    citalopram (CELEXA) 20 MG tablet    2. Insomnia, unspecified type  G47.00 traZODone (DESYREL) 50 MG tablet        Past Psychiatric History:  INPT: Denies OUTPT: denies Therapy: started this year and is current Previous meds: Celexa and Trazodone No hx of SA   Last visit 06/2022-increase patient's Celexa to 20 mg due to continued anxiety symptoms 10/2022- increased celexa to 30mg  still having some ruminative thoughts but having improvement. MDD and panic d/o were resolved.     Past Medical History: none Family Psychiatric History:  Mom: Etoh use disorder and Anxiety M Aunt: Anxiety Cousin: SA Uncles: SUD  Family History:  Family History  Problem Relation Age of Onset   Colon cancer Neg Hx    Esophageal cancer Neg Hx    Rectal cancer Neg Hx    Stomach cancer Neg Hx     Social History:  Academic/Vocational: lawnmowing business Social History   Socioeconomic History  Marital status: Married    Spouse name: Not on file   Number of children: Not on file   Years of education: Not on file   Highest education level: Not on file  Occupational History   Not on file  Tobacco Use   Smoking status: Former    Types: E-cigarettes   Smokeless tobacco: Never  Vaping Use   Vaping status: Never Used  Substance and Sexual Activity   Alcohol use: Not Currently   Drug use: Never   Sexual activity: Not on file   Other Topics Concern   Not on file  Social History Narrative   ** Merged History Encounter **       Social Drivers of Health   Financial Resource Strain: Not on file  Food Insecurity: Not on file  Transportation Needs: Not on file  Physical Activity: Not on file  Stress: Not on file  Social Connections: Not on file    Allergies:  Allergies  Allergen Reactions   Shellfish Allergy Anaphylaxis    Current Medications: Current Outpatient Medications  Medication Sig Dispense Refill   hydrOXYzine (ATARAX) 25 MG tablet Take 1 tablet (25 mg total) by mouth 3 (three) times daily as needed. 90 tablet 0   blood glucose meter kit and supplies KIT Dispense based on patient and insurance preference. Use up to four times daily as directed. (FOR ICD-9 250.00, 250.01). (Patient not taking: Reported on 10/03/2023) 1 each 0   citalopram (CELEXA) 20 MG tablet Take 1 tablet (20 mg total) by mouth daily. 90 tablet 0   diphenhydrAMINE (BENADRYL) 25 mg capsule Take 50 mg by mouth every 6 (six) hours as needed. (Patient not taking: Reported on 12/26/2021)     EPINEPHrine (AUVI-Q) 0.3 mg/0.3 mL IJ SOAJ injection Inject 0.3 mLs (0.3 mg total) into the muscle as needed for anaphylaxis. (Patient not taking: Reported on 10/17/2021) 2 each 2   glipiZIDE (GLUCOTROL) 5 MG tablet Take 1 tablet (5 mg total) by mouth 2 (two) times daily before a meal. (Patient not taking: Reported on 09/18/2023) 180 tablet 1   glucose blood test strip Use as instructed (Patient not taking: Reported on 10/03/2023) 100 each 12   nicotine (NICODERM CQ - DOSED IN MG/24 HOURS) 21 mg/24hr patch Place 1 patch (21 mg total) onto the skin daily. (Patient not taking: Reported on 10/03/2023) 28 patch 2   traZODone (DESYREL) 50 MG tablet Take 1 tablet (50 mg total) by mouth at bedtime. 90 tablet 0   Current Facility-Administered Medications  Medication Dose Route Frequency Provider Last Rate Last Admin   0.9 %  sodium chloride infusion  500 mL  Intravenous Once Iva Boop, MD        ROS: Review of Systems   Objective:  Psychiatric Specialty Exam: There were no vitals taken for this visit.There is no height or weight on file to calculate BMI.  General Appearance: Fairly Groomed  Eye Contact: Good  Speech:  Clear and Coherent and Normal Rate  Volume:  Normal  Mood:  Euthymic  Affect:  Appropriate and Congruent  Thought Content: Logical   Suicidal Thoughts:  No  Homicidal Thoughts:  No  Thought Process:  Coherent, Goal Directed, and Linear  Orientation:  Full (Time, Place, and Person)    Memory: Remote;   Fair  Judgment:  Fair  Insight:  Fair  Concentration:  Concentration: Fair and Attention Span: Fair  Recall: not formally assessed   Fund of Knowledge: Fair  Language: Fair  Psychomotor Activity:  Normal  Akathisia:  No  AIMS (if indicated): not done  Assets:  Communication Skills Desire for Improvement Financial Resources/Insurance Housing Intimacy Leisure Time Physical Health Resilience Social Support Talents/Skills Transportation Vocational/Educational  ADL's:  Intact  Cognition: WNL  Sleep:  Good   PE: General: well-appearing; no acute distress  Pulm: no increased work of breathing on room air  Strength & Muscle Tone: within normal limits Neuro: no focal neurological deficits observed  Gait & Station: normal  Metabolic Disorder Labs: Lab Results  Component Value Date   HGBA1C 5.7 07/08/2023   No results found for: "PROLACTIN" Lab Results  Component Value Date   CHOL 169 07/08/2023   TRIG 59.0 07/08/2023   HDL 57.20 07/08/2023   CHOLHDL 3 07/08/2023   VLDL 11.8 07/08/2023   LDLCALC 100 (H) 07/08/2023   LDLCALC 79 12/26/2021   Lab Results  Component Value Date   TSH 0.65 09/13/2021   TSH 0.966 05/31/2019    Therapeutic Level Labs: No results found for: "LITHIUM" No results found for: "VALPROATE" No results found for: "CBMZ"  Screenings: PHQ2-9    Flowsheet Row Office  Visit from 07/08/2023 in [facility] [place] Office Visit from 12/26/2021 in [facility] at [place] Office Visit from 09/13/2021 in [facility] at [place] Office Visit from 10/11/2020 in Primary Care at [facility] Visit from 07/12/2020 in Primary Care at [facility] Total Score 0 0 2 0 0  PHQ-9 Total Score 0 -- -- -- --      Flowsheet Row ED from 08/27/2021 in [facility] Emergency Department at [facility]  C-SSRS RISK CATEGORY No Risk       Collaboration of Care: Collaboration of Care:  Patient/Guardian was advised Release of Information must be obtained prior to any record release in order to collaborate their care with an outside provider. Patient/Guardian was advised if they have not already done so to contact the registration department to sign all necessary forms in order for [place] to release information regarding their care.   Consent: Patient/Guardian gives verbal consent for treatment and assignment of benefits for services provided during this visit. Patient/Guardian expressed understanding and agreed to proceed.   Park Pope, MD 11/18/2023, 8:39 AM

## 2023-11-18 NOTE — Addendum Note (Signed)
 Addended by: Everlena Cooper on: 11/18/2023 09:14 AM   Modules accepted: Level of Service

## 2024-01-07 ENCOUNTER — Telehealth (INDEPENDENT_AMBULATORY_CARE_PROVIDER_SITE_OTHER): Admitting: Licensed Clinical Social Worker

## 2024-01-07 ENCOUNTER — Ambulatory Visit (HOSPITAL_COMMUNITY): Admitting: Licensed Clinical Social Worker

## 2024-01-07 NOTE — Telephone Encounter (Signed)
 Klark was schedule to meet in person for a clinical assessment at 1pm today.  Clinician outreached Taryll by phone at 1:15pm when he did not appear on time.  This phone call was not answered, and a voicemail could not be left.  Clinician informed front desk staff of no show event.    Desmond Florida, LCSW, LCAS 01/07/24

## 2024-01-18 NOTE — Progress Notes (Deleted)
 BH MD Outpatient Progress Note  01/19/2024 12:50 PM Jason Decker  MRN:  [id number]  Assessment:  Jason Decker presents for follow-up evaluation in-person. On initial visit, patient presented due to recurrent panic attacks and had been diagnosed with GAD w/ panic disorder. He has been relatively stable on citalopram  20 mg and trazodone  50 mg.  Today, 01/18/24, patient reports episodic anxiety secondary to psychosocial stressors but overall continued stability in regards to his GAD with current citalopram  dose and continued use of trazodone  for sleep.  He was amenable to restarting hydroxyzine  25 mg 3 times daily as needed for anxiety as he had been on before.  We will continue to monitor his symptoms. He remains interested in eventually tapering off his psychotropics but will keep to the same for now.  He also was interested in finding a new therapist as he wants to see if a t different therapist would have different recommendations for him.  Patient to follow-up in 2 months.    Identifying Information: Jason Decker is a 48 y.o. male with a history of GAD w/ panic disorder and dyslipidemia who is an established patient with [facility] for medication management.   Plan:  # Generalized Anxiety Disorder # Panic Disorder Past medication trials:  Status of problem: remission Interventions: -- continue celexa  20 mg daily --Continue trazodone  50 mg nightly as needed --Restart hydroxyzine  25 mg 3 times daily as needed for anxiety -- Referral to therapist   Return to care in 2 months  Patient was given contact information for behavioral health clinic and was instructed to call 911 for emergencies.    Patient and plan of care will be discussed with the Attending MD, Dr. Sharalyn Dasen, who agrees with the above statement and plan.   Subjective:  Chief Complaint: Medication Management   Interval History:  Patient reports panic attacks remains in remission.  He reports taking  care of 58-year-old child and handling immature adults at his job has been very stressful for him and has led to him being more isolated and irritable for a length of stay 2 weeks at a time.  He wanted to explore restarting hydroxyzine  to aid with his anxiety as he notes feeling that he ruminates quite often.  He denies SI/HI/AVH.  He also reports wanting to establish with a new therapist as he feels the benefits he has received from former therapist has plateaued.  Visit Diagnosis:  No diagnosis found.     Past Psychiatric History:  INPT: Denies OUTPT: denies Therapy: started this year and is current Previous meds: Celexa  and Trazodone  No hx of SA   Last visit 06/2022-increase patient's Celexa  to 20 mg due to continued anxiety symptoms 10/2022- increased celexa  to 30mg  still having some ruminative thoughts but having improvement. MDD and panic d/o were resolved.     Past Medical History: none Family Psychiatric History:  Mom: Etoh use disorder and Anxiety M Aunt: Anxiety Cousin: SA Uncles: SUD  Family History:  Family History  Problem Relation Age of Onset   Colon cancer Neg Hx    Esophageal cancer Neg Hx    Rectal cancer Neg Hx    Stomach cancer Neg Hx     Social History:  Academic/Vocational: lawnmowing business Social History   Socioeconomic History   Marital status: Married    Spouse name: Not on file   Number of children: Not on file   Years of education: Not on file   Highest education level: Not on file  Occupational History   Not on file  Tobacco Use   Smoking status: Former    Types: E-cigarettes   Smokeless tobacco: Never  Vaping Use   Vaping status: Never Used  Substance and Sexual Activity   Alcohol use: Not Currently   Drug use: Never   Sexual activity: Not on file  Other Topics Concern   Not on file  Social History Narrative   ** Merged History Encounter **       Social Drivers of Health   Financial Resource Strain: Not on file  Food  Insecurity: Not on file  Transportation Needs: Not on file  Physical Activity: Not on file  Stress: Not on file  Social Connections: Not on file    Allergies:  Allergies  Allergen Reactions   Shellfish Allergy Anaphylaxis    Current Medications: Current Outpatient Medications  Medication Sig Dispense Refill   blood glucose meter kit and supplies KIT Dispense based on patient and insurance preference. Use up to four times daily as directed. (FOR ICD-9 250.00, 250.01). (Patient not taking: Reported on 10/03/2023) 1 each 0   citalopram  (CELEXA ) 20 MG tablet Take 1 tablet (20 mg total) by mouth daily. 90 tablet 0   diphenhydrAMINE (BENADRYL) 25 mg capsule Take 50 mg by mouth every 6 (six) hours as needed. (Patient not taking: Reported on 12/26/2021)     EPINEPHrine  (AUVI-Q ) 0.3 mg/0.3 mL IJ SOAJ injection Inject 0.3 mLs (0.3 mg total) into the muscle as needed for anaphylaxis. (Patient not taking: Reported on 10/17/2021) 2 each 2   glipiZIDE  (GLUCOTROL ) 5 MG tablet Take 1 tablet (5 mg total) by mouth 2 (two) times daily before a meal. (Patient not taking: Reported on 09/18/2023) 180 tablet 1   glucose blood test strip Use as instructed (Patient not taking: Reported on 10/03/2023) 100 each 12   hydrOXYzine  (ATARAX ) 25 MG tablet Take 1 tablet (25 mg total) by mouth 3 (three) times daily as needed. 90 tablet 0   nicotine  (NICODERM CQ  - DOSED IN MG/24 HOURS) 21 mg/24hr patch Place 1 patch (21 mg total) onto the skin daily. (Patient not taking: Reported on 10/03/2023) 28 patch 2   traZODone  (DESYREL ) 50 MG tablet Take 1 tablet (50 mg total) by mouth at bedtime. 90 tablet 0   Current Facility-Administered Medications  Medication Dose Route Frequency Provider Last Rate Last Admin   0.9 %  sodium chloride  infusion  500 mL Intravenous Once Kenney Peacemaker, MD        ROS: Review of Systems   Objective:  Psychiatric Specialty Exam: There were no vitals taken for this visit.There is no height or weight  on file to calculate BMI.  General Appearance: Fairly Groomed  Eye Contact: Good  Speech:  Clear and Coherent and Normal Rate  Volume:  Normal  Mood:  Euthymic  Affect:  Appropriate and Congruent  Thought Content: Logical   Suicidal Thoughts:  No  Homicidal Thoughts:  No  Thought Process:  Coherent, Goal Directed, and Linear  Orientation:  Full (Time, Place, and Person)    Memory: Remote;   Fair  Judgment:  Fair  Insight:  Fair  Concentration:  Concentration: Fair and Attention Span: Fair  Recall: not formally assessed   Fund of Knowledge: Fair  Language: Fair  Psychomotor Activity:  Normal  Akathisia:  No  AIMS (if indicated): not done  Assets:  Communication Skills Desire for Improvement Financial Resources/Insurance Housing Intimacy Leisure Time Physical Health Resilience Social Support Energy manager  ADL's:  Intact  Cognition: WNL  Sleep:  Good   PE: General: well-appearing; no acute distress  Pulm: no increased work of breathing on room air  Strength & Muscle Tone: within normal limits Neuro: no focal neurological deficits observed  Gait & Station: normal  Metabolic Disorder Labs: Lab Results  Component Value Date   HGBA1C 5.7 07/08/2023   No results found for: "PROLACTIN" Lab Results  Component Value Date   CHOL 169 07/08/2023   TRIG 59.0 07/08/2023   HDL 57.20 07/08/2023   CHOLHDL 3 07/08/2023   VLDL 11.8 07/08/2023   LDLCALC 100 (H) 07/08/2023   LDLCALC 79 12/26/2021   Lab Results  Component Value Date   TSH 0.65 09/13/2021   TSH 0.966 05/31/2019    Therapeutic Level Labs: No results found for: "LITHIUM" No results found for: "VALPROATE" No results found for: "CBMZ"  Screenings: PHQ2-9    Flowsheet Row Office Visit from 07/08/2023 in [facility] at [place] Office Visit from 12/26/2021 in [facility] at [place] Office Visit from 09/13/2021 in [facility] at [place] Office Visit from 10/11/2020 in Primary Care at [facility] Visit from 07/12/2020 in Primary Care at [facility] Total Score 0 0 2 0 0  PHQ-9 Total Score 0 -- -- -- --      Flowsheet Row ED from 08/27/2021 in [facility] Emergency Department at [facility]  C-SSRS RISK CATEGORY No Risk       Collaboration of Care: Collaboration of Care:  Patient/Guardian was advised Release of Information must be obtained prior to any record release in order to collaborate their care with an outside provider. Patient/Guardian was advised if they have not already done so to contact the registration department to sign all necessary forms in order for us  to release information regarding their care.   Consent: Patient/Guardian gives verbal consent for treatment and assignment of benefits for services provided during this visit. Patient/Guardian expressed understanding and agreed to proceed.   Augusta Blizzard, MD 01/18/2024, 12:50 PM

## 2024-01-19 ENCOUNTER — Ambulatory Visit (HOSPITAL_COMMUNITY): Admitting: Student

## 2024-03-22 ENCOUNTER — Telehealth (HOSPITAL_COMMUNITY): Payer: Self-pay

## 2024-03-22 ENCOUNTER — Other Ambulatory Visit (HOSPITAL_COMMUNITY): Payer: Self-pay | Admitting: Psychiatry

## 2024-03-22 DIAGNOSIS — G4700 Insomnia, unspecified: Secondary | ICD-10-CM

## 2024-03-22 DIAGNOSIS — F411 Generalized anxiety disorder: Secondary | ICD-10-CM

## 2024-03-22 MED ORDER — HYDROXYZINE HCL 25 MG PO TABS: 25.0000 mg | ORAL_TABLET | Freq: Three times a day (TID) | ORAL | 0 refills | Status: AC | PRN

## 2024-03-22 MED ORDER — TRAZODONE HCL 50 MG PO TABS: 50.0000 mg | ORAL_TABLET | Freq: Every day | ORAL | 0 refills | Status: DC

## 2024-03-22 MED ORDER — CITALOPRAM HYDROBROMIDE 20 MG PO TABS: 20.0000 mg | ORAL_TABLET | Freq: Every day | ORAL | 0 refills | Status: DC

## 2024-03-22 NOTE — Telephone Encounter (Signed)
 Patient is calling for a refill on medications. He missed an appointment in May and has a follow up with you on 7/22. Please review and advise, thank you

## 2024-03-25 NOTE — Progress Notes (Signed)
 BH MD Outpatient Progress Note  03/30/2024 11:38 AM Jason Decker  MRN:  [id number]  Assessment:  Jason Decker presents for follow-up evaluation in-person. On initial visit, patient presented due to recurrent panic attacks and had been diagnosed with GAD w/ panic disorder. He has been relatively stable on citalopram  20 mg and trazodone  50 mg.  Today, patient is doing well regarding sxms of anxiety. He has not had a panic attacks for multiple months. Much of our session today focused on discussion of his communication style and stress with his wife and coworkers. Patient is working on how to more effectively communicate and not let his anger get the best of him. He is insightful with why he feels and acts the way that he does. I feel he would greatly benefit from therapy, both psychodynamic and CBT. He is agreeable to starting therapy with me to work on strategies to communicate with people around him. No medication changes today.   Identifying Information: Jason Decker is a 48 y.o. male with a history of GAD w/ panic disorder and dyslipidemia who is an established patient with [facility] for medication management.   Plan:  # Generalized Anxiety Disorder # Panic Disorder Past medication trials:  Status of problem: remission Interventions: -- Continue celexa  20 mg daily -- Continue trazodone  50 mg nightly as needed -- Continue hydroxyzine  25 mg 3 times daily as needed for anxiety- has not used yet   Patient was given contact information for behavioral health clinic and was instructed to call 911 for emergencies.    Patient and plan of care will be discussed with the Attending MD, who agrees with the above statement and plan.   Subjective:  Chief Complaint: Medication Management   Interval History:  Patient seen alone.  He reports such of his stress is coming about his communication with his wife. He reports being avoidant instead of confronting issues. He  feels the issues with his wife are due to different communication styles. He feels this is also occurring at work. He feels at work if there is an Nurse, children's, he will match their energy. He denies current panic attacks, stating his most recent one was months ago.    Patient denies current SI, HI, and AVH.   Stressors include job, marital (communication, infidelity -> panic attacks)   Substance use: denies  He states growing up, he used to fight often to protect others and himself. His mother was 41 years old when she had the patient and he felt upset when his mother would show more affections towards her partner. He now understands now why this was the case but he feels this upbringing shaped how be is defensive when he communicates with others.  Visit Diagnosis:    ICD-10-CM   1. Insomnia, unspecified type  G47.00 traZODone  (DESYREL ) 50 MG tablet    2. Generalized anxiety disorder  F41.1 citalopram  (CELEXA ) 20 MG tablet       Past Psychiatric History:  INPT: Denies OUTPT: denies Therapy: started this year but has not been since January due to missed appointments Previous meds: Celexa  and Trazodone  No hx of SA   Last visit 06/2022-increase patient's Celexa  to 20 mg due to continued anxiety symptoms 10/2022- increased celexa  to 30mg  still having some ruminative thoughts but having improvement. MDD and panic d/o were resolved.     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, clinical business Relationship: married for 7 years Children: 3 kids- 27, 13, 6 Support:  friends  Social History   Socioeconomic History   Marital status: Married    Spouse name: Not on file   Number of children: Not on file   Years of education: Not on file   Highest education level: Not on file  Occupational History   Not on file  Tobacco Use   Smoking status: Former    Types: E-cigarettes   Smokeless tobacco: Never  Vaping Use   Vaping status: Never Used  Substance and Sexual Activity   Alcohol use: Not Currently   Drug use: Never   Sexual activity: Not on file  Other Topics Concern   Not on file  Social History Narrative   ** Merged History Encounter **       Social Drivers of Corporate investment banker Strain: Not on file  Food Insecurity: Not on file  Transportation Needs: Not on file  Physical Activity: Not on file  Stress: Not on file  Social Connections: Not on file    Allergies:  Allergies  Allergen Reactions   Shellfish Allergy Anaphylaxis    Current Medications: Current Outpatient Medications  Medication Sig Dispense Refill   blood glucose meter kit and supplies KIT Dispense based on patient and insurance preference. Use up to four times daily as directed. (FOR ICD-9 250.00, 250.01). (Patient not taking: Reported on 10/03/2023) 1 each 0   citalopram  (CELEXA ) 20 MG tablet Take 1 tablet (20 mg total) by mouth daily. 30 tablet 1   diphenhydrAMINE (BENADRYL) 25 mg capsule Take 50 mg by mouth every 6 (six) hours as needed. (Patient not taking: Reported on 12/26/2021)     EPINEPHrine  (AUVI-Q ) 0.3 mg/0.3 mL IJ SOAJ injection Inject 0.3 mLs (0.3 mg total) into the muscle as needed for anaphylaxis. (Patient not taking: Reported on 10/17/2021) 2 each 2   glipiZIDE  (GLUCOTROL ) 5 MG tablet Take 1 tablet (5 mg total) by mouth 2 (two) times daily before a meal. (Patient not taking: Reported on 09/18/2023) 180 tablet 1   glucose blood test strip Use as instructed (Patient not taking: Reported on 10/03/2023) 100 each 12   hydrOXYzine  (ATARAX ) 25 MG tablet Take  1 tablet (25 mg total) by mouth 3 (three) times daily as needed for up to 10 days. 15 tablet 0   nicotine  (NICODERM CQ  - DOSED IN MG/24 HOURS) 21 mg/24hr patch Place 1 patch (21 mg total) onto the skin daily. (Patient not taking: Reported on 10/03/2023) 28 patch 2   traZODone  (DESYREL ) 50 MG tablet Take 1 tablet (50 mg total) by mouth at bedtime. 30 tablet 1   Current Facility-Administered Medications  Medication Dose Route Frequency Provider Last Rate Last Admin   0.9 %  sodium chloride  infusion  500 mL Intravenous Once Avram Lupita BRAVO, MD        Objective:  Psychiatric Specialty Exam: General Appearance: appears at stated age, casually dressed and groomed   Behavior: pleasant and cooperative   Psychomotor Activity: no psychomotor agitation or retardation noted   Eye Contact: fair  Speech:  normal amount, volume and fluency    Mood: euthymic  Affect: congruent, pleasant and interactive   Thought Process: linear, goal directed, no circumstantial or tangential thought process noted, no racing thoughts or flight of ideas  Descriptions of Associations: intact   Thought Content Hallucinations: denies AH, VH , does not appear responding to stimuli  Delusions: no paranoia, delusions of control, grandeur, ideas of reference, thought broadcasting, and magical thinking  Suicidal Thoughts: denies SI, intention, plan  Homicidal Thoughts: denies HI, intention, plan   Alertness/Orientation: alert and fully oriented   Insight: fair Judgment: fair  Memory: intact   Executive Functions  Concentration: intact  Attention Span: fair  Recall: intact  Fund of Knowledge: fair   Physical Exam  General: Pleasant, well-appearing . No acute distress. Pulmonary: Normal effort. No wheezing or rales. Skin: No obvious rash or lesions. Neuro: A&Ox3.No focal deficit.  Review of Systems  No reported symptoms   Metabolic Disorder Labs: Lab Results  Component Value Date   HGBA1C 5.7 07/08/2023    No results found for: PROLACTIN Lab Results  Component Value Date   CHOL 169 07/08/2023   TRIG 59.0 07/08/2023   HDL 57.20 07/08/2023   CHOLHDL 3 07/08/2023   VLDL 11.8 07/08/2023   LDLCALC 100 (H) 07/08/2023   LDLCALC 79 12/26/2021   Lab Results  Component Value Date   TSH 0.65 09/13/2021   TSH 0.966 05/31/2019    Therapeutic Level Labs: No results found for: LITHIUM No results found for: VALPROATE No results found for: CBMZ  Screenings: PHQ2-9    Flowsheet Row Office Visit from 07/08/2023 in [facility] [place] Office Visit from 12/26/2021 in [facility] [place] Office Visit from 09/13/2021 in [facility] at [place] Office Visit from 10/11/2020 in Primary Care at [facility] Visit from 07/12/2020 in Primary Care at [facility] Total Score 0 0 2 0 0  PHQ-9 Total Score 0 -- -- -- --   Flowsheet Row ED from 08/27/2021 in [facility] Emergency Department at [facility]  C-SSRS RISK CATEGORY No Risk    Ismael Franco, MD PGY-3 Psychiatry Resident

## 2024-03-30 ENCOUNTER — Ambulatory Visit (INDEPENDENT_AMBULATORY_CARE_PROVIDER_SITE_OTHER): Admitting: Psychiatry

## 2024-03-30 ENCOUNTER — Encounter (HOSPITAL_COMMUNITY): Admitting: Psychiatry

## 2024-03-30 VITALS — BP 142/85 | Ht 71.0 in | Wt 204.4 lb

## 2024-03-30 DIAGNOSIS — F411 Generalized anxiety disorder: Secondary | ICD-10-CM | POA: Diagnosis not present

## 2024-03-30 DIAGNOSIS — G4700 Insomnia, unspecified: Secondary | ICD-10-CM | POA: Diagnosis not present

## 2024-03-30 MED ORDER — TRAZODONE HCL 50 MG PO TABS: 50.0000 mg | ORAL_TABLET | Freq: Every day | ORAL | 1 refills | Status: DC

## 2024-03-30 MED ORDER — CITALOPRAM HYDROBROMIDE 20 MG PO TABS: 20.0000 mg | ORAL_TABLET | Freq: Every day | ORAL | 1 refills | Status: DC

## 2024-04-07 ENCOUNTER — Ambulatory Visit (HOSPITAL_COMMUNITY): Admitting: Psychiatry

## 2024-04-07 DIAGNOSIS — F411 Generalized anxiety disorder: Secondary | ICD-10-CM

## 2024-04-07 NOTE — Progress Notes (Signed)
 [facility] [address] [place] [place] [address] Dept: [phone] Dept Fax: [phone]  Psychotherapy Progress Note  Patient ID: Jason Decker, male  DOB: [birth year], 48 y.o.  MRN: [id number]  04/07/2024 Start time: 7:50 End time: 8:45  Method of Visit: Face-to-Face  Present: patient  Current Concerns: communication skills, shutting down, connecting with wife  Current Symptoms: Family Stress  Psychiatric Specialty Exam: General Appearance: appears at stated age, casually dressed and groomed   Behavior: pleasant and cooperative   Psychomotor Activity: no psychomotor agitation or retardation noted   Eye Contact: fair  Speech: normal amount, volume and fluency    Mood: euthymic  Affect: congruent, pleasant and interactive   Thought Process: linear, goal directed, no circumstantial or tangential thought process noted, no racing thoughts or flight of ideas  Descriptions of Associations: intact   Thought Content Hallucinations: denies AH, VH , does not appear responding to stimuli  Delusions: no paranoia, delusions of control, grandeur, ideas of reference, thought broadcasting, and magical thinking  Suicidal Thoughts: denies SI, intention, plan  Homicidal Thoughts: denies HI, intention, plan   Alertness/Orientation: alert and fully oriented   Insight: fair Judgment: fair  Memory: intact   Executive Functions  Concentration: intact  Attention Span: fair  Recall: intact  Fund of Knowledge: fair   Physical Exam  General: Pleasant, well-appearing. No acute distress. Pulmonary: Normal effort. No wheezing or rales. Skin: No obvious rash or lesions. Neuro: A&Ox3.No focal deficit.  Review of Systems  No reported symptoms  Diagnosis: GAD  Anticipated Frequency of Visits: every other week Anticipated Length of Treatment Episode: ~6 months  Short Term Goals/Goals for Treatment  Session:  Establishing goals - Patient discusses how he wants to work on his communication skills with his coworkers and his wife.  He is wanting structure to the sessions along with homework assignments.  He also wants to work on not being so avoidant when it comes to conflict. CBT overview -We discussed the main therapy modality that we will be incorporated in his session which is cognitive behavioral therapy.  We discussed the foundation of how thoughts, feelings, and behaviors are all interconnected in situations and one of the main goals of CBT is to be able to be aware and challenge automatic thoughts and provide an alternative and more adaptive thought. Initial thought record - We completed the initial thought record exercise during the session.  He discusses a situation where him and his wife were at his father-in-law's house and he asked his wife to get him a plate of food and his wife said she would not do that because he never asked her to make a plate of food in the past.  This made him feel sad and mad at 60%.  We analyzed the automatic thought of his wife being unfair to him and not caring for the relationship.  We discussed the evidence for and against this automatic thought.  He discusses that the evidence for is that she is upset that he previously was unfaithful to her years ago and the evidence against is that she actively takes care of their children.  He states the outcome is that he is feeling sad and mad at 45%.  Progress Towards Goals: Initial  Treatment Intervention: Cognitive Behavioral therapy  Medical Necessity: Improved patient condition  Assessment Tools:    07/08/2023    1:05 PM 12/26/2021   10:12 AM 09/13/2021   12:07 PM  Depression screen  PHQ 2/9  Decreased Interest 0 0 1  Down, Depressed, Hopeless 0 0 1  PHQ - 2 Score 0 0 2  Altered sleeping 0    Tired, decreased energy 0    Change in appetite 0    Feeling bad or failure about yourself  0    Trouble  concentrating 0    Moving slowly or fidgety/restless 0    Suicidal thoughts 0    PHQ-9 Score 0     Failed to redirect to the Timeline version of the REVFS SmartLink. Flowsheet Row ED from 08/27/2021 in [facility] Emergency Department at [facility]  C-SSRS RISK CATEGORY No Risk    Patient/Guardian was advised Release of Information must be obtained prior to any record release in order to collaborate their care with an outside provider. Patient/Guardian was advised if they have not already done so to contact the registration department to sign all necessary forms in order for us  to release information regarding their care.   Consent: Patient/Guardian gives verbal consent for treatment and assignment of benefits for services provided during this visit. Patient/Guardian expressed understanding and agreed to proceed.   Plan: Follow-up in 2 weeks, assignment is to complete 1 more thought record on his own and will discuss at the beginning of the next session.  Next session will be about cognitive distortion analysis.  Ismael Franco, MD PGY-3 Psychiatry Resident

## 2024-04-21 ENCOUNTER — Ambulatory Visit (HOSPITAL_COMMUNITY): Admitting: Psychiatry

## 2024-04-21 ENCOUNTER — Telehealth (HOSPITAL_COMMUNITY): Payer: Self-pay | Admitting: Psychiatry

## 2024-04-21 NOTE — Telephone Encounter (Signed)
 Patient did not show up for the therapy appointment.  I spoke with the patient and he states that he called earlier to cancel the appointment. Scheduled his next appointment for 8/27.  Ismael Franco, MD PGY-3 Psychiatry Resident

## 2024-04-26 NOTE — Progress Notes (Signed)
 BH MD Outpatient Progress Note  05/03/2024 10:45 AM Jason Decker  MRN:  [id number]  Assessment:  Jason Decker presents for follow-up evaluation in-person. He has been relatively stable on Celexa  20 mg and trazodone  50 mg.  Today, patient is doing well. Does report some stress with school starting with his children in which he would wake up around 3 AM and snack. He is noting some lower libido since being on Celexa  and I discussed starting BuSpar  to curb the adverse effect of low libido with Celexa  along with helping his anxiety. Patient also plans to discuss more of his stress regarding his wife during our therapy appointment on Wednesday.   Identifying Information: Jason Decker is a 48 y.o. male with a history of GAD w/ panic disorder and dyslipidemia who is an established patient with [facility] for medication management.   Plan:  # Generalized Anxiety Disorder # Panic Disorder Past medication trials:  Status of problem: remission Interventions: -- Continue Celexa  20 mg daily -- Start BuSpar  7.5 mg BID for anxiety and lower libido -- Continue trazodone  50 mg nightly as needed   Patient was given contact information for behavioral health clinic and was instructed to call 911 for emergencies.    Patient and plan of care will be discussed with the Attending MD, who agrees with the above statement and plan.   Subjective:  Chief Complaint: Medication Management   Interval History:  Patient seen alone.  Patient reports feeling good today. Since the previous visit, he feels his medications are doing well for him. 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 His son started academy today and his wife dropped his daughter off to work. Patient reports the following adverse effects: reports low libido   Patient reports falling asleep at 10 PM and waking up at 3 AM and snacking and falls back asleep in a few minutes. He states the reason if because he  had home issues regarding his wife on his mind. Patient reports good appetite.   Patient denies current SI, HI, and AVH.   Substance use: occasional alcohol on birthdays, denies marijuana, tobacco   Visit Diagnosis:    ICD-10-CM   1. Low libido  R68.82 busPIRone  (BUSPAR ) 7.5 MG tablet    2. Generalized anxiety disorder  F41.1 citalopram  (CELEXA ) 20 MG tablet    3. Insomnia, unspecified type  G47.00 traZODone  (DESYREL ) 50 MG tablet        Past Psychiatric History:  INPT: Denies OUTPT: denies Therapy: started this year but has not been since January due to missed appointments Previous meds: Celexa  and Trazodone  No hx of SA     Past Medical History: none  Family Psychiatric History:  Mom: Etoh use disorder and Anxiety M Aunt: Anxiety Cousin: SA Uncles: SUD  Family History:  Family History  Problem Relation Age of Onset   Colon cancer Neg Hx    Esophageal cancer Neg Hx    Rectal cancer Neg Hx    Stomach cancer Neg Hx     Social History:  Living: lives with wife and two kids in [place] Occupation: Merchandiser, 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, 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, clinical business Relationship: married for 7 years Children: 3 kids- 27, 13, 6 Support: friends  Social History   Socioeconomic History   Marital status: Married    Spouse name: Not on file   Number of children: Not on file   Years of education: Not on file   Highest  education level: Not on file  Occupational History   Not on file  Tobacco Use   Smoking status: Former    Types: E-cigarettes   Smokeless tobacco: Never  Vaping Use   Vaping status: Never Used  Substance and Sexual Activity   Alcohol use: Not Currently   Drug use: Never   Sexual activity: Not on file  Other Topics Concern   Not on file  Social History Narrative   ** Merged History Encounter **       Social Drivers of Health   Financial Resource Strain: Not on file  Food Insecurity: Not on file  Transportation Needs: Not on  file  Physical Activity: Not on file  Stress: Not on file  Social Connections: Not on file    Allergies:  Allergies  Allergen Reactions   Shellfish Allergy Anaphylaxis    Current Medications: Current Outpatient Medications  Medication Sig Dispense Refill   busPIRone  (BUSPAR ) 7.5 MG tablet Take 1 tablet (7.5 mg total) by mouth 2 (two) times daily. 60 tablet 1   citalopram  (CELEXA ) 20 MG tablet Take 1 tablet (20 mg total) by mouth daily. 30 tablet 1   traZODone  (DESYREL ) 50 MG tablet Take 1 tablet (50 mg total) by mouth at bedtime. 30 tablet 1   No current facility-administered medications for this visit.    Objective:  Psychiatric Specialty Exam: General Appearance: appears at stated age, casually dressed and groomed   Behavior: pleasant and cooperative   Psychomotor Activity: no psychomotor agitation or retardation noted   Eye Contact: fair  Speech: normal amount, volume and fluency    Mood: euthymic  Affect: congruent, pleasant and interactive   Thought Process: linear, goal directed, no circumstantial or tangential thought process noted, no racing thoughts or flight of ideas  Descriptions of Associations: intact   Thought Content Hallucinations: denies AH, VH , does not appear responding to stimuli  Delusions: no paranoia, delusions of control, grandeur, ideas of reference, thought broadcasting, and magical thinking  Suicidal Thoughts: denies SI, intention, plan  Homicidal Thoughts: denies HI, intention, plan   Alertness/Orientation: alert and fully oriented   Insight: fair Judgment: fair  Memory: intact   Executive Functions  Concentration: intact  Attention Span: fair  Recall: intact  Fund of Knowledge: fair   Physical Exam  General: Pleasant, well-appearing. No acute distress. Pulmonary: Normal effort. No wheezing or rales. Skin: No obvious rash or lesions. Neuro: A&Ox3.No focal deficit.  Review of Systems  No reported symptoms    Metabolic  Disorder Labs: Lab Results  Component Value Date   HGBA1C 5.7 07/08/2023   No results found for: PROLACTIN Lab Results  Component Value Date   CHOL 169 07/08/2023   TRIG 59.0 07/08/2023   HDL 57.20 07/08/2023   CHOLHDL 3 07/08/2023   VLDL 11.8 07/08/2023   LDLCALC 100 (H) 07/08/2023   LDLCALC 79 12/26/2021   Lab Results  Component Value Date   TSH 0.65 09/13/2021   TSH 0.966 05/31/2019    Therapeutic Level Labs: No results found for: LITHIUM No results found for: VALPROATE No results found for: CBMZ  Screenings: [employer] Row Office Visit from 07/08/2023 in [facility] [place] Office Visit from 12/26/2021 in [facility] at [facility] Office Visit from 09/13/2021 in [facility] at [place] Office Visit from 10/11/2020 in Primary Care at [facility] Visit from 07/12/2020 in Primary Care at [facility] Total Score 0  0 2 0 0  PHQ-9 Total Score 0 -- -- -- --   Flowsheet Row ED from 08/27/2021 in [facility] Emergency Department at [facility]  C-SSRS RISK CATEGORY No Risk   Televisit via video: I connected with Jason Decker on 05/03/2024 at 10:30 AM EDT by a video enabled telemedicine application and verified that I am speaking with the correct person using two identifiers.  Location: Patient: Work Provider: Office   I discussed the limitations of evaluation and management by telemedicine and the availability of in person appointments. The patient expressed understanding and agreed to proceed.  I discussed the assessment and treatment plan with the patient. The patient was provided an opportunity to ask questions and all were answered. The patient agreed with the plan and demonstrated an understanding of the instructions.   The patient was advised to call back or seek an in-person evaluation if the symptoms worsen or if the condition fails to improve as anticipated.   Jason Franco, MD PGY-3 Psychiatry Resident

## 2024-05-03 ENCOUNTER — Telehealth (HOSPITAL_BASED_OUTPATIENT_CLINIC_OR_DEPARTMENT_OTHER): Admitting: Psychiatry

## 2024-05-03 DIAGNOSIS — R6882 Decreased libido: Secondary | ICD-10-CM | POA: Diagnosis not present

## 2024-05-03 DIAGNOSIS — G4700 Insomnia, unspecified: Secondary | ICD-10-CM

## 2024-05-03 DIAGNOSIS — F411 Generalized anxiety disorder: Secondary | ICD-10-CM

## 2024-05-03 MED ORDER — CITALOPRAM HYDROBROMIDE 20 MG PO TABS: 20.0000 mg | ORAL_TABLET | Freq: Every day | ORAL | 1 refills | Status: DC

## 2024-05-03 MED ORDER — BUSPIRONE HCL 7.5 MG PO TABS: 7.5000 mg | ORAL_TABLET | Freq: Two times a day (BID) | ORAL | 1 refills | Status: AC

## 2024-05-03 MED ORDER — TRAZODONE HCL 50 MG PO TABS: 50.0000 mg | ORAL_TABLET | Freq: Every day | ORAL | 1 refills | Status: DC

## 2024-05-03 NOTE — Addendum Note (Signed)
 Addended by: CARVIN CROCK on: 05/03/2024 11:33 AM   Modules accepted: Level of Service

## 2024-05-05 ENCOUNTER — Ambulatory Visit (HOSPITAL_BASED_OUTPATIENT_CLINIC_OR_DEPARTMENT_OTHER): Admitting: Psychiatry

## 2024-05-05 DIAGNOSIS — F411 Generalized anxiety disorder: Secondary | ICD-10-CM

## 2024-05-05 NOTE — Progress Notes (Signed)
 [facility] [address] [place] [address] [place] [address] Dept: [phone] Dept Fax: [phone]  Psychotherapy Progress Note  Patient ID: Jason Decker, male  DOB: 15-Nov-1975, 48 y.o.  MRN: [id number]  05/05/2024 Start time: 8 AM  End time: 8:45 AM  Method of Visit: Face-to-Face  Present: patient  Current Concerns: Tension with wife  Current Symptoms: Irritability towards wife  Psychiatric Specialty Exam: General Appearance: appears at stated age, casually dressed and groomed   Behavior: pleasant and cooperative   Psychomotor Activity: no psychomotor agitation or retardation noted   Eye Contact: fair  Speech: normal amount, volume and fluency    Mood: euthymic  Affect: congruent, pleasant and interactive   Thought Process: linear, goal directed, no circumstantial or tangential thought process noted, no racing thoughts or flight of ideas  Descriptions of Associations: intact   Thought Content Hallucinations: denies AH, VH , does not appear responding to stimuli  Delusions: no paranoia, delusions of control, grandeur, ideas of reference, thought broadcasting, and magical thinking  Suicidal Thoughts: denies SI, intention, plan  Homicidal Thoughts: denies HI, intention, plan   Alertness/Orientation: alert and fully oriented   Insight: fair Judgment: fair  Memory: intact   Executive Functions  Concentration: intact  Attention Span: fair  Recall: intact  Fund of Knowledge: fair    Diagnosis: GAD  Anticipated Frequency of Visits: every other week Anticipated Length of Treatment Episode: ~6 months  Short Term Goals/Goals for Treatment Session:  - Controlling anger and sadness towards wife Patient discusses that a few weeks ago, it was his anniversary and his wife's birthday on the same day.  In the situation, he planned to take her out to dinner and he bought her a gift.  Later in the day,  his wife told him that her parents are coming to the dinner in which he was confused.  She stated that since it was her birthday that she can invite who ever she would like.  This caused the patient to feel like his wife does not care about his anniversary or being his wife.  His emotions were sad and mad and the situation.  He finds trouble thinking of evidence against the automatic thought that his wife does not care.  He later states that she also got him an anniversary gift of a card in a wedding band and he states that since he was in his emotions that they did not talk about their gifts for each other.  He states that he shut down during the dinner and did not speak with his wife for multiple days after the event.  We discussed the mnemonic DEAR MAN and how to utilize this in communicating with his wife to confront her about that event.  He also asked about a time when he asked her to wear a certain article thing in which she responded that he is narcissistic.  He asked if he was narcissistic in that moment and we discussed how his wife may have felt like her boundary with crossed and being told what to wear.  Progress Towards Goals: Not Progressing as patient continues to shut down during his conversations with his wife that have tension  Treatment Intervention: Behavior modification  Medical Necessity: Improved patient condition  Assessment Tools:    07/08/2023    1:05 PM 12/26/2021   10:12 AM 09/13/2021   12:07 PM  Depression screen PHQ 2/9  Decreased Interest 0 0 1  Down, Depressed, Hopeless  0 0 1  PHQ - 2 Score 0 0 2  Altered sleeping 0    Tired, decreased energy 0    Change in appetite 0    Feeling bad or failure about yourself  0    Trouble concentrating 0    Moving slowly or fidgety/restless 0    Suicidal thoughts 0    PHQ-9 Score 0     Failed to redirect to the Timeline version of the REVFS SmartLink. Flowsheet Row ED from 08/27/2021 in [facility] Emergency Department at  [facility]  C-SSRS RISK CATEGORY No Risk     Patient/Guardian was advised Release of Information must be obtained prior to any record release in order to collaborate their care with an outside provider. Patient/Guardian was advised if they have not already done so to contact the registration department to sign all necessary forms in order for us  to release information regarding their care.   Consent: Patient/Guardian gives verbal consent for treatment and assignment of benefits for services provided during this visit. Patient/Guardian expressed understanding and agreed to proceed.   Plan: f/u in 2 weeks, patient is to work on communicating with his wife using the mnemonic DEAR MAN  [name], MD 05/05/2024

## 2024-05-19 ENCOUNTER — Ambulatory Visit (HOSPITAL_BASED_OUTPATIENT_CLINIC_OR_DEPARTMENT_OTHER): Admitting: Psychiatry

## 2024-05-19 DIAGNOSIS — F411 Generalized anxiety disorder: Secondary | ICD-10-CM

## 2024-05-19 NOTE — Addendum Note (Signed)
 Addended by: CARVIN CROCK on: 05/19/2024 04:56 PM   Modules accepted: Level of Service

## 2024-05-19 NOTE — Progress Notes (Signed)
 [facility] [address] [place] [address] [place] [address] Dept: [phone] Dept Fax: [phone]  Psychotherapy Progress Note  Patient ID: Jason Decker, male  DOB: July 15, 1976, 48 y.o.  MRN: [id number]  05/19/2024 Start time: 1:55 PM End time: 2:40 PM  Method of Visit: Video: Virtual Visit via Video Encounter:  I connected with Jason Decker on 05/19/24 at  2:00 PM EDT by a video enabled telemedicine application and verified that I am speaking with the correct person using two identifiers.   Location: Patient: Car parked Provider: Home office   I discussed the limitations of evaluation and management by telemedicine and the availability of in person appointments. The patient expressed understanding and agreed to proceed.  I discussed the assessment and treatment plan with the patient. The patient was provided an opportunity to ask questions and all were answered. The patient agreed with the plan and demonstrated an understanding of the instructions.   The patient was advised to call back or seek an in-person evaluation if the symptoms worsen or if the condition fails to improve as anticipated.   Present: patient  Current Concerns: marital tensions  Current Symptoms: Irritability  Psychiatric Specialty Exam: General Appearance: Casual  Eye Contact:  Good  Speech:  Clear and Coherent  Volume:  Normal  Mood:  Hopeless  Affect:  Congruent  Thought Process:  Coherent  Orientation:  Full (Time, Place, and Person)  Thought Content:  Logical  Suicidal Thoughts:  No  Homicidal Thoughts:  No  Memory:  Remote;   Good  Judgement:  Fair  Insight:  Good  Psychomotor Activity:  Negative  Concentration:  Concentration: Good  Recall:  Good  Fund of Knowledge:Good  Language: Good  Akathisia:  Negative  Handed:  Right  AIMS (if indicated):  not done  Assets:  Communication Skills Desire for  Improvement Financial Resources/Insurance Housing Intimacy Physical Health Resilience Social Support Transportation  ADL's:  Intact  Cognition: WNL  Sleep:  Good     Diagnosis: GAD  Anticipated Frequency of Visits: every other week Anticipated Length of Treatment Episode: 6 months  Short Term Goals/Goals for Treatment Session:  - Work on communicating with his wife Patient discusses ongoing avoidance with his wife.  He details an argument that occurred between him and his wife because she wanted to be dropped off at the club after visiting a friend's house in which he stated that he was not going to do that in which she became upset and called him a narcissist.  Then a few days later, his son asked about making noodles after he finished his meal in which the patient stated no because he just ate and the patient's son went to his mother in which she allowed for him to eat noodles.  He felt upset in the situation because he felt like they should have been on the same team and he now feels his son will go to his wife if he says no to things.  He admits to having difficulty communicating and attributes his fear of confrontation to his ego and wanting to avoid drama.  We discussed that this may be a temporary fix but ultimately does not work to fix the root of the problem.  Validated the difficulties of confronting situations which may mean for someone to be more vulnerable and expose their own shortcomings but ultimately he understood that if his goal is to make his marriage work, he will have to put in  his part with communicating his feelings.  He also describes that his son has been sleeping in the same room as him and his wife and he feels that his quality time with his wife has decreased because of this.  However, he denies communicating this situation with his wife.  We discussed that his wife likely does not know how he is feeling or what he is thinking because he has not communicated this to  her.  Discussed the importance of clear communication and assuming that the other person knows what you are feeling are thinking can lead to miscommunication.  Discussed using I statements and the dear Man mnemonic to more effectively communicate.  Upon reflecting on the situation, he understands that his wife may not know that he feels that she is not trying as hard as he would like.  He plans on introducing the idea of bringing her into his therapy sessions and for this conversation he plans on utilizing the techniques that he learned during the session and understands that even if there is pushback from her, that he can still communicate his desires and revisit the conversation in the future.  Discussed relaxation techniques such as deep breathing if the difficult conversations feel physically overwhelming.  Progress Towards Goals: Not progressing as evidenced to continued avoidance with his wife with conversations.  Treatment Intervention: Behavior therapy  Medical Necessity: Improved patient condition  Assessment Tools:    07/08/2023    1:05 PM 12/26/2021   10:12 AM 09/13/2021   12:07 PM  Depression screen PHQ 2/9  Decreased Interest 0 0 1  Down, Depressed, Hopeless 0 0 1  PHQ - 2 Score 0 0 2  Altered sleeping 0    Tired, decreased energy 0    Change in appetite 0    Feeling bad or failure about yourself  0    Trouble concentrating 0    Moving slowly or fidgety/restless 0    Suicidal thoughts 0    PHQ-9 Score 0     Failed to redirect to the Timeline version of the REVFS SmartLink. Flowsheet Row ED from 08/27/2021 in [facility] Emergency Department at [facility]  C-SSRS RISK CATEGORY No Risk     Patient/Guardian was advised Release of Information must be obtained prior to any record release in order to collaborate their care with an outside provider. Patient/Guardian was advised if they have not already done so to contact the registration department to sign all necessary  forms in order for us  to release information regarding their care.   Consent: Patient/Guardian gives verbal consent for treatment and assignment of benefits for services provided during this visit. Patient/Guardian expressed understanding and agreed to proceed.   Plan: Follow-up in 2 weeks, work on Brewing technologist, use a dear man mneumonic to communicate with his wife regarding joining therapy  Alvie Speltz B Shay Jhaveri, MD 05/19/2024

## 2024-06-02 ENCOUNTER — Ambulatory Visit (HOSPITAL_BASED_OUTPATIENT_CLINIC_OR_DEPARTMENT_OTHER): Admitting: Psychiatry

## 2024-06-02 DIAGNOSIS — F411 Generalized anxiety disorder: Secondary | ICD-10-CM

## 2024-06-02 NOTE — Progress Notes (Signed)
 [facility] [address] [place] [address] [place] [address] Dept: [phone] Dept Fax: [phone]  Psychotherapy Progress Note  Patient ID: Jason Decker, male  DOB: September 15, 1975, 48 y.o.  MRN: [id number]  06/02/2024 Start time: 1:55 PM End time: 2:35 PM  Method of Visit: Video: Virtual Visit via Video Encounter:  I connected with Jason Decker on 06/02/24 at  2:00 PM EDT by a video enabled telemedicine application and verified that I am speaking with the correct person using two identifiers.   Location: Patient: Set designer Provider: Office   I discussed the limitations of evaluation and management by telemedicine and the availability of in person appointments. The patient expressed understanding and agreed to proceed.  I discussed the assessment and treatment plan with the patient. The patient was provided an opportunity to ask questions and all were answered. The patient agreed with the plan and demonstrated an understanding of the instructions.   The patient was advised to call back or seek an in-person evaluation if the symptoms worsen or if the condition fails to improve as anticipated.   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  Current Symptoms: Anxiety and Irritability  Psychiatric Specialty Exam: General Appearance: appears at stated age, casually dressed and groomed   Behavior: pleasant and cooperative   Psychomotor Activity: no psychomotor agitation or retardation noted   Eye Contact: fair  Speech: normal amount, volume and fluency    Mood: irritable and anxious Affect: congruent, pleasant and interactive   Thought Process: linear, goal directed, no circumstantial or tangential thought process noted, no racing thoughts or flight of ideas  Descriptions of Associations: intact   Thought Content Hallucinations: denies AH, VH , does not appear responding to  stimuli  Delusions: no paranoia, delusions of control, grandeur, ideas of reference, thought broadcasting, and magical thinking  Suicidal Thoughts: denies SI, intention, plan  Homicidal Thoughts: denies HI, intention, plan   Alertness/Orientation: alert and fully oriented   Insight: fair Judgment: fair  Memory: intact   Executive Functions  Concentration: intact  Attention Span: fair  Recall: intact  Fund of Knowledge: fair    Diagnosis: GAD  Anticipated Frequency of Visits: every 2 weeks Anticipated Length of Treatment Episode: 6 months  Short Term Goals/Goals for Treatment Session:   - Work tension Discussed how patient's bosses boss has been irritating him.  He states that his boss is upset that he clocked in on behalf of another person.  He feels that his boss has never helped him in the past.   Patient is considering leaving this job and is able to outline the positives and negatives with this decision.  He states that he has been thinking about this for some time now.  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  He reports speaking to his wife about this decision.  - Talking with wife  He states that he is having difficulty confronting his wife regarding the possibility of her coming to therapy.  He states that he was not able to confront her since his previous appointment even though he is wanted to because he is nervous.  He states he is unsure why he is nervous because he feels that she would actually agreed to coming to his therapy session.  He states that he spoke with one of his friends about his communication  with his wife and his friend recommended for him to spend quality time discussing how other day went at the end of the days.  I encouraged him to take part in this and he states that he plans on letting his wife know that he wants to incorporate this.  He states that when he gets  home at this time, he typically would go upstairs and watch TV and she would go downstairs and watch TV.  He states that they watch different types of shows.  I discussed the idea of compromising and sometimes watching shows with her and vice versa and this is more so to show that they are willing to spend time with each other even if it is not an activity that they would choose.  He agrees to having the goal of talking to his wife about checking in at the end of his days and also spending some quality time watching TV together.  Progress Towards Goals: Progressing as evidenced to talking to his wife more about his work stress however minimally progressing regarding strengthening there communication skills and quality time  Treatment Intervention: Behavior therapy  Medical Necessity: Improved patient condition  Assessment Tools:    07/08/2023    1:05 PM 12/26/2021   10:12 AM 09/13/2021   12:07 PM  Depression screen PHQ 2/9  Decreased Interest 0 0 1  Down, Depressed, Hopeless 0 0 1  PHQ - 2 Score 0 0 2  Altered sleeping 0    Tired, decreased energy 0    Change in appetite 0    Feeling bad or failure about yourself  0    Trouble concentrating 0    Moving slowly or fidgety/restless 0    Suicidal thoughts 0    PHQ-9 Score 0     Failed to redirect to the Timeline version of the REVFS SmartLink. Flowsheet Row ED from 08/27/2021 in [facility] Emergency Department at [facility]  C-SSRS RISK CATEGORY No Risk    Patient/Guardian was advised Release of Information must be obtained prior to any record release in order to collaborate their care with an outside provider. Patient/Guardian was advised if they have not already done so to contact the registration department to sign all necessary forms in order for us  to release information regarding their care.   Consent: Patient/Guardian gives verbal consent for treatment and assignment of benefits for services provided during this visit.  Patient/Guardian expressed understanding and agreed to proceed.   Plan: f/u in 2 weeks  [name], MD 06/02/2024

## 2024-06-07 NOTE — Progress Notes (Signed)
 BH MD Outpatient Progress Note  This encounter was created in error - please disregard.  Jason Decker  MRN:  [id number]  Assessment:  Jason Decker presents for follow-up evaluation. He has been relatively stable on Celexa  20 mg and trazodone  50 mg. In the prior visit,  we started BuSpar  to aid with lower libido and anxiety.  Identifying Information: Jason Decker is a 48 y.o. male with a history of GAD w/ panic disorder and dyslipidemia who is an established patient with [facility] for medication management.   Plan:  # Generalized Anxiety Disorder # Panic Disorder -- Continue Celexa  20 mg daily -- Start BuSpar  7.5 mg BID for anxiety and lower libido -- Continue trazodone  50 mg nightly as needed   Patient was given contact information for behavioral health clinic and was instructed to call 911 for emergencies.    Patient and plan of care will be discussed with the Attending MD, who agrees with the above statement and plan.   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, clinical business Relationship: married for 7 years Children: 3 kids- 27, 13, 6 Support: friends  Family History:  Family History  Problem Relation Age of Onset   Colon cancer Neg Hx    Esophageal cancer Neg Hx    Rectal cancer Neg Hx    Stomach cancer Neg Hx     Social History   Socioeconomic History   Marital status: Married    Spouse name: Not on file   Number of children: Not on  file   Years of education: Not on file   Highest education level: Not on file  Occupational History   Not on file  Tobacco Use   Smoking status: Former    Types: E-cigarettes   Smokeless tobacco: Never  Vaping Use   Vaping status: Never Used  Substance and Sexual Activity   Alcohol use: Not Currently   Drug use: Never   Sexual activity: Not on file  Other Topics Concern   Not on file  Social History Narrative   ** Merged History Encounter **       Social Drivers of Health   Financial Resource Strain: Not on file  Food Insecurity: Not on file  Transportation Needs: Not on file  Physical Activity: Not on file  Stress: Not on file  Social Connections: Not on file    Allergies:  Allergies  Allergen Reactions   Shellfish Allergy Anaphylaxis    Current Medications: Current Outpatient Medications  Medication Sig Dispense Refill   busPIRone  (BUSPAR ) 7.5 MG tablet Take 1 tablet (7.5 mg total) by mouth 2 (two) times daily. 60 tablet 1   citalopram  (CELEXA ) 20 MG tablet Take 1 tablet (20 mg total) by mouth daily. 30 tablet 1   traZODone  (DESYREL ) 50 MG tablet Take 1 tablet (50 mg total) by mouth at bedtime. 30 tablet 1   No current facility-administered medications for this visit.    Objective:  Metabolic Disorder Labs: Lab Results  Component  Value Date   HGBA1C 5.7 07/08/2023   No results found for: PROLACTIN Lab Results  Component Value Date   CHOL 169 07/08/2023   TRIG 59.0 07/08/2023   HDL 57.20 07/08/2023   CHOLHDL 3 07/08/2023   VLDL 11.8 07/08/2023   LDLCALC 100 (H) 07/08/2023   LDLCALC 79 12/26/2021   Lab Results  Component Value Date   TSH 0.65 09/13/2021   TSH 0.966 05/31/2019    Therapeutic Level Labs: No results found for: LITHIUM No results found for: VALPROATE No results found for: CBMZ  Screenings: PHQ2-9    Flowsheet Row Office Visit from 07/08/2023 in [facility] [place] Office Visit from 12/26/2021  in [facility] [place] Office Visit from 09/13/2021 in [facility] [place] Office Visit from 10/11/2020 in Primary Care at [facility] Visit from 07/12/2020 in Primary Care at [facility] Total Score 0 0 2 0 0  PHQ-9 Total Score 0 -- -- -- --   Flowsheet Row ED from 08/27/2021 in [facility] Emergency Department at [facility]  C-SSRS RISK CATEGORY No Risk     Jason Franco, MD PGY-3 Psychiatry Resident

## 2024-06-14 ENCOUNTER — Encounter (HOSPITAL_COMMUNITY): Admitting: Psychiatry

## 2024-06-15 NOTE — Progress Notes (Addendum)
 BH MD Outpatient Progress Note  06/23/2024 8:54 AM Jason Decker  MRN:  [id number]  Televisit via video: I connected with Jason Decker on 10/15 at  2:30 PM EDT by a video enabled telemedicine application and verified that I am speaking with the correct person using two identifiers.  Location: Patient: work Provider: office   I discussed the limitations of evaluation and management by telemedicine and the availability of in person appointments. The patient expressed understanding and agreed to proceed.  I discussed the assessment and treatment plan with the patient. The patient was provided an opportunity to ask questions and all were answered. The patient agreed with the plan and demonstrated an understanding of the instructions.   The patient was advised to call back or seek an in-person evaluation if the symptoms worsen or if the condition fails to improve as anticipated.   Assessment:  Jason Decker presents for follow-up evaluation in-person. He has been relatively stable on Celexa  20 mg and trazodone  50 mg. Today, patient is doing well and remains stable, continues to feel his medications are helping contoll his depression and anxiety. He did not start the BuSpar  due to concern about the medication. He states libido is still an ongoing issue so I rediscussed the benefits and possible AE of BuSpar  and he is agreeable to starting. F/u in 3 months.  Identifying Information: Jason Decker is a 48 y.o. male with a history of GAD w/ panic disorder and dyslipidemia who is an established patient with [facility] for medication management.   Plan:  # Generalized Anxiety Disorder # Panic Disorder -- Continue Celexa  20 mg daily -- START BuSpar  7.5 mg BID for anxiety and lower libido as previously discussed -- Continue trazodone  50 mg nightly as needed -- Therapy with this provider  Patient was given contact information for behavioral health clinic and was instructed to  call 911 for emergencies.    Patient and plan of care will be discussed with the Attending MD, who agrees with the above statement and plan.   Subjective:  Chief Complaint: Medication Management   Interval History:  Patient seen alone.  Patient reports feeling good today. Since the previous visit, he denies new stressors.  Regarding psychiatric symptoms, he reports his symptoms of depression and anxiety. Patient reports the medications are doing well in helping with his anxiety. Patient reports the following adverse effects: denies. He denies starting the BuSpar  being worried of the medication. He discussed benefits and adverse effects of BuSpar  and is now amenable to restarting.  Patient reports varied sleep, states the trazodone  does help him fall asleep. Patient reports good appetite and states having a balanced diet.  Patient denies current SI, HI, and AVH.   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, clinical business Relationship: married for 7 years Children: 3 kids- 27, 13, 6 Support: friends  Family History:  Family History  Problem Relation Age of Onset   Colon cancer Neg Hx    Esophageal cancer Neg Hx  Rectal cancer Neg Hx    Stomach cancer Neg Hx     Social History   Socioeconomic History   Marital status: Married    Spouse name: Not on file   Number of children: Not on file   Years of education: Not on file   Highest education level: Not on file   Occupational History   Not on file  Tobacco Use   Smoking status: Former    Types: E-cigarettes   Smokeless tobacco: Never  Vaping Use   Vaping status: Never Used  Substance and Sexual Activity   Alcohol use: Not Currently   Drug use: Never   Sexual activity: Not on file  Other Topics Concern   Not on file  Social History Narrative   ** Merged History Encounter **       Social Drivers of Health   Financial Resource Strain: Not on file  Food Insecurity: Not on file  Transportation Needs: Not on file  Physical Activity: Not on file  Stress: Not on file  Social Connections: Not on file    Allergies:  Allergies  Allergen Reactions   Shellfish Allergy Anaphylaxis    Current Medications: Current Outpatient Medications  Medication Sig Dispense Refill   busPIRone  (BUSPAR ) 7.5 MG tablet Take 1 tablet (7.5 mg total) by mouth 2 (two) times daily. 60 tablet 1   citalopram  (CELEXA ) 20 MG tablet Take 1 tablet (20 mg total) by mouth daily. 30 tablet 1   traZODone  (DESYREL ) 50 MG tablet Take 1 tablet (50 mg total) by mouth at bedtime. 30 tablet 1   No current facility-administered medications for this visit.    Objective:  Psychiatric Specialty Exam: General Appearance: appears at stated age, casually dressed and groomed   Behavior: pleasant and cooperative   Psychomotor Activity: no psychomotor agitation or retardation noted   Eye Contact: fair  Speech: normal amount, volume and fluency    Mood: euthymic  Affect: congruent, pleasant and interactive   Thought Process: linear, goal directed, no circumstantial or tangential thought process noted, no racing thoughts or flight of ideas  Descriptions of Associations: intact   Thought Content Hallucinations: denies AH, VH , does not appear responding to stimuli  Delusions: no paranoia, delusions of control, grandeur, ideas of reference, thought broadcasting, and magical thinking  Suicidal Thoughts: denies SI, intention,  plan  Homicidal Thoughts: denies HI, intention, plan   Alertness/Orientation: alert and fully oriented   Insight: fair Judgment: fair  Memory: intact   Executive Functions  Concentration: intact  Attention Span: fair  Recall: intact  Fund of Knowledge: fair   Physical Exam  General: Pleasant, well-appearing. No acute distress. Pulmonary: Normal effort. No wheezing or rales. Skin: No obvious rash or lesions. Neuro: A&Ox3.No focal deficit.  Review of Systems  No reported symptoms    Metabolic Disorder Labs: Lab Results  Component Value Date   HGBA1C 5.7 07/08/2023   No results found for: PROLACTIN Lab Results  Component Value Date   CHOL 169 07/08/2023   TRIG 59.0 07/08/2023   HDL 57.20 07/08/2023   CHOLHDL 3 07/08/2023   VLDL 11.8 07/08/2023   LDLCALC 100 (H) 07/08/2023   LDLCALC 79 12/26/2021   Lab Results  Component Value Date   TSH 0.65 09/13/2021   TSH 0.966 05/31/2019    Therapeutic Level Labs: No results found for: LITHIUM No results found for: VALPROATE No results found for: CBMZ  Screenings: PHQ2-9    Flowsheet Row Office Visit from 07/08/2023 in [place]  [facility] Visit from 12/26/2021 in [facility] Office Visit from 09/13/2021 in [facility] [place] Office Visit from 10/11/2020 in Primary Care at [facility] Visit from 07/12/2020 in Primary Care at [facility] Total Score 0 0 2 0 0  PHQ-9 Total Score 0 -- -- -- --   Flowsheet Row ED from 08/27/2021 in [facility] at [facility]  C-SSRS RISK CATEGORY No Risk     Ismael Franco, MD PGY-3 Psychiatry Resident

## 2024-06-16 ENCOUNTER — Telehealth (HOSPITAL_COMMUNITY): Admitting: Psychiatry

## 2024-06-16 ENCOUNTER — Ambulatory Visit (HOSPITAL_COMMUNITY): Admitting: Psychiatry

## 2024-06-23 ENCOUNTER — Telehealth (HOSPITAL_COMMUNITY): Admitting: Psychiatry

## 2024-06-23 DIAGNOSIS — R6882 Decreased libido: Secondary | ICD-10-CM

## 2024-06-23 DIAGNOSIS — G4700 Insomnia, unspecified: Secondary | ICD-10-CM | POA: Diagnosis not present

## 2024-06-23 DIAGNOSIS — F410 Panic disorder [episodic paroxysmal anxiety] without agoraphobia: Secondary | ICD-10-CM | POA: Diagnosis not present

## 2024-06-23 DIAGNOSIS — F411 Generalized anxiety disorder: Secondary | ICD-10-CM | POA: Diagnosis not present

## 2024-06-23 MED ORDER — TRAZODONE HCL 50 MG PO TABS: 50.0000 mg | ORAL_TABLET | Freq: Every day | ORAL | 2 refills | Status: DC

## 2024-06-23 MED ORDER — CITALOPRAM HYDROBROMIDE 20 MG PO TABS: 20.0000 mg | ORAL_TABLET | Freq: Every day | ORAL | 2 refills | Status: DC

## 2024-06-30 ENCOUNTER — Ambulatory Visit (HOSPITAL_COMMUNITY): Admitting: Psychiatry

## 2024-06-30 DIAGNOSIS — F411 Generalized anxiety disorder: Secondary | ICD-10-CM

## 2024-06-30 NOTE — Progress Notes (Signed)
 [facility] [address] [place] [address] Dept: [phone] Dept Fax: [phone]  Psychotherapy Progress Note  Patient ID: Jason Decker, male  DOB: [birth year], 48 y.o.  MRN: [id number]  06/30/2024 Start time: 3:30 PM End time: 4:30 PM  Method of Visit: Face-to-Face  Present: patient  Current Concerns: communication with wife  Current Symptoms: Anxiety Psychiatric Specialty Exam: General Appearance: appears at stated age, casually dressed and groomed   Behavior: pleasant and cooperative   Psychomotor Activity: no psychomotor agitation or retardation noted   Eye Contact: fair  Speech: normal amount, volume and fluency    Mood: anxious Affect: congruent  Thought Process: linear, goal directed, no circumstantial or tangential thought process noted, no racing thoughts or flight of ideas  Descriptions of Associations: intact   Thought Content Hallucinations: denies AH, VH , does not appear responding to stimuli  Delusions: no paranoia, delusions of control, grandeur, ideas of reference, thought broadcasting, and magical thinking  Suicidal Thoughts: denies SI, intention, plan  Homicidal Thoughts: denies HI, intention, plan   Alertness/Orientation: alert and fully oriented   Insight: fair Judgment: fair  Memory: intact   Executive Functions  Concentration: intact  Attention Span: fair  Recall: intact  Fund of Knowledge: fair    Diagnosis: GAD  Anticipated Frequency of Visits: every other week Anticipated Length of Treatment Episode: 6 months  Short Term Goals/Goals for Treatment Session:  -- Tension with wife Discussed him feeling anxious and sad regarding his wife being sick recently. He states that he has thought more about how he can change himself regarding the tension between him and his wife. He states some of the changes he is trying to implement in the past 2 weeks  is being less involved with his work drama so in turn he can be more engaged in conversation at home. He wants to be of an active listener with his wife. He states an incident that happened recently was him wanting to surprise his wife with paying for her hair stylist but she made a comment which took him aback. Stated that his conversation with his wife ended up being through text which was an argument. Reflecting back, he understands that the conversation may have had a better outcome if it occurred in person. He is motivated to have the conversation about what him and his wife value in a marriage.   Progress Towards Goals: Progressing as evidence to his self reflection on previous goals during therapy sessions like being less accusatory towards his wife  Treatment Intervention: Cognitive Behavioral therapy  Medical Necessity: Improved patient condition  Assessment Tools:    07/08/2023    1:05 PM 12/26/2021   10:12 AM 09/13/2021   12:07 PM  Depression screen PHQ 2/9  Decreased Interest 0 0 1  Down, Depressed, Hopeless 0 0 1  PHQ - 2 Score 0 0 2  Altered sleeping 0    Tired, decreased energy 0    Change in appetite 0    Feeling bad or failure about yourself  0    Trouble concentrating 0    Moving slowly or fidgety/restless 0    Suicidal thoughts 0    PHQ-9 Score 0     Failed to redirect to the Timeline version of the REVFS SmartLink. Flowsheet Row ED from 08/27/2021 in [facility] Emergency Department at [facility]  C-SSRS RISK CATEGORY No Risk    Patient/Guardian was advised Release of Information must be obtained  prior to any record release in order to collaborate their care with an outside provider. Patient/Guardian was advised if they have not already done so to contact the registration department to sign all necessary forms in order for us  to release information regarding their care.   Consent: Patient/Guardian gives verbal consent for treatment and assignment of benefits  for services provided during this visit. Patient/Guardian expressed understanding and agreed to proceed.   Plan: f/u in 2 weeks, check in on the conversation with his wife regarding his values in a marriage  [name], MD 06/30/2024

## 2024-07-14 ENCOUNTER — Ambulatory Visit (HOSPITAL_COMMUNITY): Admitting: Psychiatry

## 2024-08-04 ENCOUNTER — Ambulatory Visit (HOSPITAL_COMMUNITY): Admitting: Psychiatry

## 2024-08-04 DIAGNOSIS — F411 Generalized anxiety disorder: Secondary | ICD-10-CM | POA: Diagnosis not present

## 2024-08-04 NOTE — Progress Notes (Signed)
  [facility] [address] [place] [address] [place] [address] Dept: [phone] Dept Fax: [phone]  Psychotherapy Progress Note  Patient ID: Jason Decker, male  DOB: August 20, 1976, 48 y.o.  MRN: [id number]  08/04/2024 Start time: 8 AM End time: 9 AM  Method of Visit: Face-to-Face  Present: patient and medical student  Current Concerns: communication with wife  Current Symptoms: Irritability Psychiatric Specialty Exam: General Appearance: appears at stated age, casually dressed and groomed   Behavior: pleasant and cooperative   Psychomotor Activity: no psychomotor agitation or retardation noted   Eye Contact: fair  Speech: normal amount, volume and fluency    Mood: euthymic during assessment but when describing situations, sometimes irritable Affect: congruent, pleasant and interactive   Thought Process: linear, goal directed, no circumstantial or tangential thought process noted, no racing thoughts or flight of ideas  Descriptions of Associations: intact   Thought Content Hallucinations: denies AH, VH , does not appear responding to stimuli  Delusions: no paranoia, delusions of control, grandeur, ideas of reference, thought broadcasting, and magical thinking  Suicidal Thoughts: denies SI, intention, plan  Homicidal Thoughts: denies HI, intention, plan   Alertness/Orientation: alert and fully oriented   Insight: fair Judgment: fair  Memory: intact   Executive Functions  Concentration: intact  Attention Span: fair  Recall: intact  Fund of Knowledge: fair    Diagnosis: GAD  Anticipated Frequency of Visits: every other week Anticipated Length of Treatment Episode: 6 months  Short Term Goals/Goals for Treatment Session:   1) decrease anxiety 2) resist flight/freeze response 3) identify anxiety inducing thoughts- cognitive distortions 4) completed thought record analysis worksheet regarding  situation with his wife   Progress Towards Goals: Progressing AEB pt being able to stop and think of the negative thought and reframe in situations with his wife  Treatment Intervention: Cognitive Behavioral therapy  Medical Necessity: Improved patient condition  Assessment Tools:    07/08/2023    1:05 PM 12/26/2021   10:12 AM 09/13/2021   12:07 PM  Depression screen PHQ 2/9  Decreased Interest 0 0 1  Down, Depressed, Hopeless 0 0 1  PHQ - 2 Score 0 0 2  Altered sleeping 0    Tired, decreased energy 0    Change in appetite 0    Feeling bad or failure about yourself  0    Trouble concentrating 0    Moving slowly or fidgety/restless 0    Suicidal thoughts 0    PHQ-9 Score 0        Data saved with a previous flowsheet row definition   Failed to redirect to the Timeline version of the REVFS SmartLink. Flowsheet Row ED from 08/27/2021 in [facility] Department at [facility]  C-SSRS RISK CATEGORY No Risk    Collaboration of Care: Psychiatrist AEB myself  Patient/Guardian was advised Release of Information must be obtained prior to any record release in order to collaborate their care with an outside provider. Patient/Guardian was advised if they have not already done so to contact the registration department to sign all necessary forms in order for us  to release information regarding their care.   Consent: Patient/Guardian gives verbal consent for treatment and assignment of benefits for services provided during this visit. Patient/Guardian expressed understanding and agreed to proceed.   Plan: f/u in 2 weeks  [name], MD 08/04/2024

## 2024-08-11 ENCOUNTER — Ambulatory Visit (HOSPITAL_COMMUNITY): Admitting: Psychiatry

## 2024-08-25 ENCOUNTER — Ambulatory Visit (HOSPITAL_COMMUNITY): Admitting: Psychiatry

## 2024-08-25 DIAGNOSIS — F411 Generalized anxiety disorder: Secondary | ICD-10-CM | POA: Diagnosis not present

## 2024-08-25 NOTE — Progress Notes (Signed)
°  [facility] [address] [place] [address] Dept: [phone] Dept Fax: [phone]  Psychotherapy Progress Note  Patient ID: Jason Decker, male  DOB: December 17, 1975, 48 y.o.  MRN: [id number]  08/25/2024 Start time: 3:30 PM End time: 4:30 PM  Method of Visit: Face-to-Face  Present: patient  Current Concerns: marriage tension  Current Symptoms: Anxiety  Psychiatric Specialty Exam: General Appearance: Casual and Well Groomed  Eye Contact:  Fair  Speech:  Clear and Coherent and Normal Rate  Volume:  Normal  Mood:  Anxious  Affect:  Congruent  Thought Process:  Coherent and Goal Directed  Orientation:  Full (Time, Place, and Person)  Thought Content:  Logical  Suicidal Thoughts:  No  Homicidal Thoughts:  No  Memory:  Remote;   Good  Judgement:  Fair  Insight:  Fair  Psychomotor Activity:  Normal  Concentration:  Concentration: Good  Recall:  Good  Fund of Knowledge:Good  Language: Good  Akathisia:  No  Handed:  Right  AIMS (if indicated):  not done  Assets:  Communication Skills Desire for Improvement Financial Resources/Insurance Housing Intimacy Leisure Time Physical Health Resilience Social Support Talents/Skills Transportation Vocational/Educational  ADL's:  Intact  Cognition: WNL  Sleep:  Good     Diagnosis: GAD  Anticipated Frequency of Visits: every other week Anticipated Length of Treatment Episode: 6 month  Short Term Goals/Goals for Treatment Session:   1) decrease anxiety 2) resist flight/freeze response 3) identify anxiety inducing thoughts 4) discussing when you, I feel statements and using with confrontations with his spouse 5) reframing cognitive distortions    Progress Towards Goals: Progressing AEB reflecting on his prior behaviors when it comes to anger and anxiety  Treatment Intervention: Cognitive Behavioral therapy  Medical Necessity:  Improved patient condition  Assessment Tools:    07/08/2023    1:05 PM 12/26/2021   10:12 AM 09/13/2021   12:07 PM  Depression screen PHQ 2/9  Decreased Interest 0 0 1  Down, Depressed, Hopeless 0 0 1  PHQ - 2 Score 0 0 2  Altered sleeping 0    Tired, decreased energy 0    Change in appetite 0    Feeling bad or failure about yourself  0    Trouble concentrating 0    Moving slowly or fidgety/restless 0    Suicidal thoughts 0    PHQ-9 Score 0        Data saved with a previous flowsheet row definition   Failed to redirect to the Timeline version of the REVFS SmartLink. Flowsheet Row ED from 08/27/2021 in [facility] Emergency Department at [facility]  C-SSRS RISK CATEGORY No Risk     Patient/Guardian was advised Release of Information must be obtained prior to any record release in order to collaborate their care with an outside provider. Patient/Guardian was advised if they have not already done so to contact the registration department to sign all necessary forms in order for us  to release information regarding their care.   Consent: Patient/Guardian gives verbal consent for treatment and assignment of benefits for services provided during this visit. Patient/Guardian expressed understanding and agreed to proceed.   Plan: f/u in 2 weeks - work on when you, I feel statements - work on creating boundaries with his in laws  [name], MD 08/25/2024

## 2024-09-12 NOTE — Progress Notes (Unsigned)
 BH MD Outpatient Progress Note  09/13/2024 2:34 PM [name]  MRN:  [id number]  Televisit via video: I connected with Jason Decker on 1/5 at  2:30 PM EST by a video enabled telemedicine application and verified that I am speaking with the correct person using two identifiers.  Location: Patient: car Provider: office   I discussed the limitations of evaluation and management by telemedicine and the availability of in person appointments. The patient expressed understanding and agreed to proceed.  I discussed the assessment and treatment plan with the patient. The patient was provided an opportunity to ask questions and all were answered. The patient agreed with the plan and demonstrated an understanding of the instructions.   The patient was advised to call back or seek an in-person evaluation if the symptoms worsen or if the condition fails to improve as anticipated.   Assessment:  Jason Decker presents for follow-up evaluation in-person. In the prior visit, BuSpar  was start to aid with anxiety and lower libido. Today, patient continues to do well regarding his anxiety and feels both medication and therapy has been helping him. He does want to trial the BuSpar  to see if it will aid with the low libido and some breakthrough anxiety. Will start that tomorrow and f/u in 6 weeks.  Identifying Information: Jason Decker is a 49 y.o. male with a history of GAD w/ panic disorder and dyslipidemia who is an established patient with [facility] for medication management.   Plan:  # Generalized Anxiety Disorder # Panic Disorder -- Continue Celexa  20 mg daily -- Start BuSpar  7.5 mg BID for anxiety and lower libido as previously discussed -- Continue trazodone  50 mg nightly as needed -- Therapy with this provider  Patient was given contact information for behavioral health clinic and was instructed to call 911 for emergencies.    Patient and plan of care will be discussed  with the Attending MD, who agrees with the above statement and plan.   Subjective:  Chief Complaint: Medication Management   Interval History:  Patient seen alone.  Patient reports feeling good today. Since the previous visit, he notes the holidays were fair.  Regarding psychiatric symptoms, he notes the medications are helpful with this anxiety. He notes incorporating the reframing techniques that we discuss in therapy in his daily life. Patient reports the medications are helping his symptoms stay stable. Patient reports the following adverse effects: denies. He states that he is still having lower libido and some anxiety but has not started BuSpar  yet. I discussed the risks/benefits/adverse effects of starting buspar  and he does agree to start it tomorrow.   Patient reports good sleep, attributing this to his trazodone . Patient reports good appetite.   Patient denies current SI, HI, and AVH.   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, clinical business Relationship: married for 7 years Children: 3 kids- 27, 13, 6 Support: friends  Family History:  Family History  Problem Relation Age of Onset   Colon cancer Neg Hx  Esophageal cancer Neg Hx    Rectal cancer Neg Hx    Stomach cancer Neg Hx     Social History   Socioeconomic History   Marital status: Married    Spouse name: Not on file   Number of children: Not on file   Years of education: Not on file   Highest education  level: Not on file  Occupational History   Not on file  Tobacco Use   Smoking status: Former    Types: E-cigarettes   Smokeless tobacco: Never  Vaping Use   Vaping status: Never Used  Substance and Sexual Activity   Alcohol use: Not Currently   Drug use: Never   Sexual activity: Not on file  Other Topics Concern   Not on file  Social History Narrative   ** Merged History Encounter **       Social Drivers of Health   Tobacco Use: Medium Risk (10/03/2023)   Patient History    Smoking Tobacco Use: Former    Smokeless Tobacco Use: Never    Passive Exposure: Not on Actuary Strain: Not on file  Food Insecurity: Not on file  Transportation Needs: Not on file  Physical Activity: Not on file  Stress: Not on file  Social Connections: Not on file  Depression (PHQ2-9): Low Risk (07/08/2023)   Depression (PHQ2-9)    PHQ-2 Score: 0  Alcohol Screen: Not on file  Housing: Not on file  Utilities: Not on file  Health Literacy: Not on file    Allergies:  Allergies  Allergen Reactions   Shellfish Allergy Anaphylaxis    Current Medications: Current Outpatient Medications  Medication Sig Dispense Refill   busPIRone  (BUSPAR ) 7.5 MG tablet Take 7.5 mg by mouth 2 (two) times daily.     citalopram  (CELEXA ) 20 MG tablet Take 1 tablet (20 mg total) by mouth daily. 30 tablet 2   traZODone  (DESYREL ) 50 MG tablet Take 1 tablet (50 mg total) by mouth at bedtime. 30 tablet 2   No current facility-administered medications for this visit.    Objective:  Psychiatric Specialty Exam: General Appearance: appears at stated age, casually dressed and groomed   Behavior: pleasant and cooperative   Psychomotor Activity: no psychomotor agitation or retardation noted   Eye Contact: fair  Speech: normal amount, volume and fluency    Mood: euthymic  Affect: congruent, pleasant and interactive   Thought Process: linear, goal directed, no circumstantial or tangential thought  process noted, no racing thoughts or flight of ideas  Descriptions of Associations: intact   Thought Content Hallucinations: denies AH, VH , does not appear responding to stimuli  Delusions: no paranoia, delusions of control, grandeur, ideas of reference, thought broadcasting, and magical thinking  Suicidal Thoughts: denies SI, intention, plan  Homicidal Thoughts: denies HI, intention, plan   Alertness/Orientation: alert and fully oriented   Insight: fair Judgment: fair  Memory: intact   Executive Functions  Concentration: intact  Attention Span: fair  Recall: intact  Fund of Knowledge: fair   Physical Exam  General: Pleasant, well-appearing. No acute distress. Pulmonary: Normal effort. No wheezing or rales. Skin: No obvious rash or lesions. Neuro: A&Ox3.No focal deficit.  Review of Systems  No reported symptoms   Metabolic Disorder Labs: Lab Results  Component Value Date   HGBA1C 5.7 07/08/2023   No results found for: PROLACTIN Lab Results  Component Value Date   CHOL 169 07/08/2023   TRIG 59.0 07/08/2023   HDL 57.20 07/08/2023  CHOLHDL 3 07/08/2023   VLDL 11.8 07/08/2023   LDLCALC 100 (H) 07/08/2023   LDLCALC 79 12/26/2021   Lab Results  Component Value Date   TSH 0.65 09/13/2021   TSH 0.966 05/31/2019    Therapeutic Level Labs: No results found for: LITHIUM No results found for: VALPROATE No results found for: CBMZ  Screenings: PHQ2-9    Flowsheet Row Office Visit from 07/08/2023 in [facility] [place] Office Visit from 12/26/2021 in [facility] [place] Office Visit from 09/13/2021 in [facility] [place] Office Visit from 10/11/2020 in [facility] Care at [facility] Visit from 07/12/2020 in Primary Care at [facility] Total Score 0 0 2 0 0  PHQ-9 Total Score 0 -- -- -- --   Flowsheet Row ED from 08/27/2021 in [facility] at [facility]   C-SSRS RISK CATEGORY No Risk     Ismael Franco, MD PGY-3 Psychiatry Resident

## 2024-09-13 ENCOUNTER — Telehealth (HOSPITAL_COMMUNITY): Admitting: Psychiatry

## 2024-09-13 DIAGNOSIS — F410 Panic disorder [episodic paroxysmal anxiety] without agoraphobia: Secondary | ICD-10-CM

## 2024-09-13 DIAGNOSIS — F411 Generalized anxiety disorder: Secondary | ICD-10-CM

## 2024-09-13 DIAGNOSIS — G4700 Insomnia, unspecified: Secondary | ICD-10-CM

## 2024-09-13 MED ORDER — CITALOPRAM HYDROBROMIDE 20 MG PO TABS: 20.0000 mg | ORAL_TABLET | Freq: Every day | ORAL | 2 refills | Status: AC

## 2024-09-13 MED ORDER — TRAZODONE HCL 50 MG PO TABS: 50.0000 mg | ORAL_TABLET | Freq: Every day | ORAL | 2 refills | Status: AC

## 2024-09-13 NOTE — Addendum Note (Signed)
 Addended by: CARVIN CROCK on: 09/13/2024 03:34 PM   Modules accepted: Level of Service

## 2024-09-22 ENCOUNTER — Ambulatory Visit (HOSPITAL_BASED_OUTPATIENT_CLINIC_OR_DEPARTMENT_OTHER): Admitting: Psychiatry

## 2024-09-22 DIAGNOSIS — F411 Generalized anxiety disorder: Secondary | ICD-10-CM | POA: Diagnosis not present

## 2024-09-22 NOTE — Progress Notes (Signed)
 " BEHAVIORAL [facility] [address] [place] [address] [place] [address] Dept: [phone] Dept Fax: [phone]  Psychotherapy Progress Note  Patient ID: Jason Decker, male  DOB: Jul 14, 1976, 49 y.o.  MRN: [id number]  09/22/2024 Start time: 3 PM End time: 4 PM  Method of Visit: video  Televisit via video: I connected with [name] on 1/14 at  3:00 PM EST by a video enabled telemedicine application and verified that I am speaking with the correct person using two identifiers.  Location: Patient: home Provider: home office   I discussed the limitations of evaluation and management by telemedicine and the availability of in person appointments. The patient expressed understanding and agreed to proceed.  I discussed the assessment and treatment plan with the patient. The patient was provided an opportunity to ask questions and all were answered. The patient agreed with the plan and demonstrated an understanding of the instructions.   The patient was advised to call back or seek an in-person evaluation if the symptoms worsen or if the condition fails to improve as anticipated.   Present: patient  Current Concerns: marriage tension, hometown insecurities  Current Symptoms: Anxiety  Psychiatric Specialty Exam: General Appearance: Casual and Well Groomed  Eye Contact:  Fair  Speech:  Clear and Coherent and Normal Rate  Volume:  Normal  Mood:  Anxious  Affect:  Congruent  Thought Process:  Coherent and Goal Directed  Orientation:  Full (Time, Place, and Person)  Thought Content:  Logical  Suicidal Thoughts:  No  Homicidal Thoughts:  No  Memory:  Remote;   Good  Judgement:  Fair  Insight:  Fair  Psychomotor Activity:  Normal  Concentration:  Concentration: Good  Recall:  Good  Fund of Knowledge:Good  Language: Good  Akathisia:  No  Handed:  Right  AIMS (if indicated):  not done  Assets:  Communication  Skills Desire for Improvement Financial Resources/Insurance Housing Intimacy Leisure Time Physical Health Resilience Social Support Talents/Skills Transportation Vocational/Educational  ADL's:  Intact  Cognition: WNL  Sleep:  Good    Diagnosis: GAD  Anticipated Frequency of Visits: every other week Anticipated Length of Treatment Episode: 6 month  Short Term Goals/Goals for Treatment Session:   1) decrease anxiety 2) identify anxiety inducing thoughts -- completed thought record about his thought of people not liking him or people assuming he is still the old him -> anxiety -> avoidance 3) discussing when you, I feel statements and using with confrontations with his spouse -- roleplayed this in the session -- discussed why confrontations are difficult -- wants to confront wife in going to marriage counseling 4) reframing cognitive distortions -- evidence for and against cognitive distortions -- discussing catch, check, change method   Interval history: Pt and his wife are going to [place] today. He feels that he needs couples counseling as he feels that there are still days where they disagree. He states when he talks to his mom about the past, he becomes triggered. They talk about how the pt previously used to be. He talks about how his dad was absent and his mother and him had a strained relationship. He doesn't know why he is uncomfortable when he goes to his hometown. Discussed why he is uncomfortable with confrontations.  Progress Towards Goals: Slowly progressing aeb having motivation to confront wife but still having anxiety and not doing this  Treatment Intervention: Cognitive Behavioral therapy  Medical Necessity: Improved patient condition  Assessment Tools:  07/08/2023    1:05 PM 12/26/2021   10:12 AM 09/13/2021   12:07 PM  Depression screen PHQ 2/9  Decreased Interest 0 0 1  Down, Depressed, Hopeless 0 0 1  PHQ - 2 Score 0 0 2  Altered sleeping 0     Tired, decreased energy 0    Change in appetite 0    Feeling bad or failure about yourself  0    Trouble concentrating 0    Moving slowly or fidgety/restless 0    Suicidal thoughts 0    PHQ-9 Score 0        Data saved with a previous flowsheet row definition   Failed to redirect to the Timeline version of the REVFS SmartLink. Flowsheet Row ED from 08/27/2021 in [facility] Emergency Department at [facility]  C-SSRS RISK CATEGORY No Risk     Patient/Guardian was advised Release of Information must be obtained prior to any record release in order to collaborate their care with an outside provider. Patient/Guardian was advised if they have not already done so to contact the registration department to sign all necessary forms in order for us  to release information regarding their care.   Consent: Patient/Guardian gives verbal consent for treatment and assignment of benefits for services provided during this visit. Patient/Guardian expressed understanding and agreed to proceed.   Plan: f/u in 2 weeks - work on when you, I feel statements - work on one thought record on his own  [name], MD 09/22/2024    "

## 2024-10-04 ENCOUNTER — Ambulatory Visit (HOSPITAL_COMMUNITY): Admitting: Psychiatry

## 2024-10-13 ENCOUNTER — Encounter (HOSPITAL_COMMUNITY): Admitting: Psychiatry

## 2024-10-14 NOTE — Progress Notes (Signed)
 This encounter was created in error - please disregard.

## 2024-10-20 ENCOUNTER — Ambulatory Visit (HOSPITAL_COMMUNITY): Admitting: Psychiatry

## 2024-10-25 ENCOUNTER — Telehealth (HOSPITAL_COMMUNITY): Admitting: Psychiatry

## 2024-10-27 ENCOUNTER — Ambulatory Visit (HOSPITAL_COMMUNITY): Admitting: Psychiatry

## 2024-11-03 ENCOUNTER — Ambulatory Visit (HOSPITAL_COMMUNITY): Admitting: Psychiatry
